# Patient Record
Sex: Female | Born: 1990 | Race: White | Hispanic: No | Marital: Single | Smoking: Current every day smoker
Health system: Southern US, Community
[De-identification: ages and names within clinical notes are randomized; demographics above are authoritative.]

## PROBLEM LIST (undated history)

## (undated) ENCOUNTER — Inpatient Hospital Stay (HOSPITAL_COMMUNITY): Payer: Self-pay

## (undated) DIAGNOSIS — R569 Unspecified convulsions: Secondary | ICD-10-CM

## (undated) DIAGNOSIS — N390 Urinary tract infection, site not specified: Secondary | ICD-10-CM

## (undated) DIAGNOSIS — C7490 Malignant neoplasm of unspecified part of unspecified adrenal gland: Secondary | ICD-10-CM

## (undated) DIAGNOSIS — F32A Depression, unspecified: Secondary | ICD-10-CM

## (undated) DIAGNOSIS — B182 Chronic viral hepatitis C: Secondary | ICD-10-CM

## (undated) DIAGNOSIS — B009 Herpesviral infection, unspecified: Secondary | ICD-10-CM

## (undated) DIAGNOSIS — F319 Bipolar disorder, unspecified: Secondary | ICD-10-CM

## (undated) DIAGNOSIS — F329 Major depressive disorder, single episode, unspecified: Secondary | ICD-10-CM

## (undated) HISTORY — DX: Depression, unspecified: F32.A

## (undated) HISTORY — PX: CHOLECYSTECTOMY: SHX55

## (undated) HISTORY — DX: Major depressive disorder, single episode, unspecified: F32.9

## (undated) HISTORY — DX: Bipolar disorder, unspecified: F31.9

## (undated) HISTORY — DX: Chronic viral hepatitis C: B18.2

## (undated) HISTORY — DX: Unspecified convulsions: R56.9

## (undated) HISTORY — DX: Herpesviral infection, unspecified: B00.9

## (undated) HISTORY — DX: Malignant neoplasm of unspecified part of unspecified adrenal gland: C74.90

## (undated) HISTORY — PX: BACK SURGERY: SHX140

---

## 2012-08-06 ENCOUNTER — Telehealth (HOSPITAL_COMMUNITY): Payer: Self-pay | Admitting: *Deleted

## 2012-08-06 ENCOUNTER — Inpatient Hospital Stay (HOSPITAL_COMMUNITY)
Admission: AD | Admit: 2012-08-06 | Payer: Medicaid Other | Source: Other Acute Inpatient Hospital | Admitting: Psychiatry

## 2013-02-27 ENCOUNTER — Encounter (HOSPITAL_COMMUNITY): Payer: Self-pay | Admitting: Family

## 2013-02-27 ENCOUNTER — Inpatient Hospital Stay (HOSPITAL_COMMUNITY)
Admission: AD | Admit: 2013-02-27 | Discharge: 2013-02-27 | Disposition: A | Discharge status: Home / Self Care | Payer: Self-pay | Source: Ambulatory Visit | Attending: Obstetrics & Gynecology | Admitting: Obstetrics & Gynecology

## 2013-02-27 ENCOUNTER — Inpatient Hospital Stay (HOSPITAL_COMMUNITY): Payer: Self-pay

## 2013-02-27 DIAGNOSIS — O99891 Other specified diseases and conditions complicating pregnancy: Secondary | ICD-10-CM | POA: Insufficient documentation

## 2013-02-27 DIAGNOSIS — O26899 Other specified pregnancy related conditions, unspecified trimester: Secondary | ICD-10-CM

## 2013-02-27 DIAGNOSIS — N949 Unspecified condition associated with female genital organs and menstrual cycle: Secondary | ICD-10-CM | POA: Insufficient documentation

## 2013-02-27 DIAGNOSIS — O9989 Other specified diseases and conditions complicating pregnancy, childbirth and the puerperium: Secondary | ICD-10-CM

## 2013-02-27 DIAGNOSIS — O99330 Smoking (tobacco) complicating pregnancy, unspecified trimester: Secondary | ICD-10-CM | POA: Insufficient documentation

## 2013-02-27 DIAGNOSIS — R109 Unspecified abdominal pain: Secondary | ICD-10-CM | POA: Insufficient documentation

## 2013-02-27 HISTORY — DX: Urinary tract infection, site not specified: N39.0

## 2013-02-27 HISTORY — DX: Unspecified convulsions: R56.9

## 2013-02-27 HISTORY — DX: Malignant neoplasm of unspecified part of unspecified adrenal gland: C74.90

## 2013-02-27 LAB — HCG, QUANTITATIVE, PREGNANCY: hCG, Beta Chain, Quant, S: 8149 m[IU]/mL — ABNORMAL HIGH (ref <5)

## 2013-02-27 LAB — URINALYSIS, ROUTINE W REFLEX MICROSCOPIC
Leukocytes, UA: NEGATIVE
Nitrite: NEGATIVE
Specific Gravity, Urine: 1.025 (ref 1.005–1.030)
Urobilinogen, UA: 0.2 mg/dL (ref 0.0–1.0)
pH: 6.5 (ref 5.0–8.0)

## 2013-02-27 LAB — CBC
MCV: 85.3 fL (ref 78.0–100.0)
Platelets: 266 10*3/uL (ref 150–400)
RBC: 4.48 MIL/uL (ref 3.87–5.11)
RDW: 12.8 % (ref 11.5–15.5)
WBC: 11.2 10*3/uL — ABNORMAL HIGH (ref 4.0–10.5)

## 2013-02-27 LAB — WET PREP, GENITAL

## 2013-02-27 NOTE — MAU Note (Signed)
22 yo female, G1P0, presents to MAU via EMS with c/o LLQ intermittent cramping since 1100 today. Denies VB. Unsure of LMP. Reports +HPT yesterday.

## 2013-02-27 NOTE — MAU Provider Note (Signed)
History     CSN: [id number]  Arrival date and time: 02/27/13 1336   None     Chief Complaint  Patient presents with  . Abdominal Pain   HPI Comments: Erika Potter 22 y.o. G1P0 presents to MAU for left lower pelvic pain in early pregnancy. She is by questionable LMP [redacted]w[redacted]d. She is hard of hearing, has history of cancer.      Abdominal Pain      Past Medical History  Diagnosis Date  . Neuroblastoma   . Seizures   . UTI (lower urinary tract infection)     Past Surgical History  Procedure Laterality Date  . Cholecystectomy      History reviewed. No pertinent family history.  History  Substance Use Topics  . Smoking status: Current Every Day Smoker -- 1.00 packs/day    Types: Cigarettes  . Smokeless tobacco: Never Used  . Alcohol Use: Yes    Allergies:  Allergies  Allergen Reactions  . Aspirin Hives    Prescriptions prior to admission  Medication Sig Dispense Refill  . Prenatal Vit-Fe Fumarate-FA (PRENATAL MULTIVITAMIN) TABS tablet Take 1 tablet by mouth daily at 12 noon.        Review of Systems  Constitutional: Negative.        Hard of hearing  HENT: Negative.   Eyes: Negative.   Respiratory: Negative.   Cardiovascular: Negative.   Gastrointestinal: Positive for abdominal pain.  Genitourinary: Negative.   Musculoskeletal: Negative.   Skin: Negative.        Abdominal scar  Neurological: Negative.   Psychiatric/Behavioral: Negative.    Physical Exam   Blood pressure 119/68, pulse 88, resp. rate 16, last menstrual period 12/30/2012.  Physical Exam  Constitutional: She is oriented to person, place, and time. She appears well-developed and well-nourished. No distress.  Hard of hearing  HENT:  Head: Normocephalic and atraumatic.  Eyes: Pupils are equal, round, and reactive to light.  Cardiovascular: Normal rate, regular rhythm and normal heart sounds.   Respiratory: Effort normal and breath sounds normal. No respiratory distress. She has  no wheezes.  GI: Soft. Bowel sounds are normal. She exhibits no mass. There is tenderness. There is no rebound.  Neurological: She is alert and oriented to person, place, and time.  Skin: Skin is warm and dry.  Psychiatric: She has a normal mood and affect. Her behavior is normal. Judgment and thought content normal.   Results for orders placed during the hospital encounter of 02/27/13 (from the past 24 hour(s))  URINALYSIS, ROUTINE W REFLEX MICROSCOPIC     Status: None   Collection Time    02/27/13  1:50 PM      Result Value Range   Color, Urine YELLOW  YELLOW   APPearance CLEAR  CLEAR   Specific Gravity, Urine 1.025  1.005 - 1.030   pH 6.5  5.0 - 8.0   Glucose, UA NEGATIVE  NEGATIVE mg/dL   Hgb urine dipstick NEGATIVE  NEGATIVE   Bilirubin Urine NEGATIVE  NEGATIVE   Ketones, ur NEGATIVE  NEGATIVE mg/dL   Protein, ur NEGATIVE  NEGATIVE mg/dL   Urobilinogen, UA 0.2  0.0 - 1.0 mg/dL   Nitrite NEGATIVE  NEGATIVE   Leukocytes, UA NEGATIVE  NEGATIVE  POCT PREGNANCY, URINE     Status: Abnormal   Collection Time    02/27/13  2:02 PM      Result Value Range   Preg Test, Ur POSITIVE (*) NEGATIVE  CBC     Status:  Abnormal   Collection Time    02/27/13  3:30 PM      Result Value Range   WBC 11.2 (*) 4.0 - 10.5 K/uL   RBC 4.48  3.87 - 5.11 MIL/uL   Hemoglobin 13.6  12.0 - 15.0 g/dL   HCT 54.0  98.1 - 19.1 %   MCV 85.3  78.0 - 100.0 fL   MCH 30.4  26.0 - 34.0 pg   MCHC 35.6  30.0 - 36.0 g/dL   RDW 47.8  29.5 - 62.1 %   Platelets 266  150 - 400 K/uL  HCG, QUANTITATIVE, PREGNANCY     Status: Abnormal   Collection Time    02/27/13  3:30 PM      Result Value Range   hCG, Beta Chain, Quant, S 8149 (*) <5 mIU/mL  ABO/RH     Status: None   Collection Time    02/27/13  3:30 PM      Result Value Range   ABO/RH(D) O NEG    WET PREP, GENITAL     Status: Abnormal   Collection Time    02/27/13  3:45 PM      Result Value Range   Yeast Wet Prep HPF POC NONE SEEN  NONE SEEN   Trich, Wet  Prep NONE SEEN  NONE SEEN   Clue Cells Wet Prep HPF POC FEW (*) NONE SEEN   WBC, Wet Prep HPF POC MODERATE (*) NONE SEEN   US Ob Comp Less 14 Wks  02/27/2013   CLINICAL DATA:  Pelvic pain.  Find  EXAM: OBSTETRIC <14 WK [place] AND TRANSVAGINAL OB US  TECHNIQUE: Both transabdominal and transvaginal ultrasound examinations were performed for complete evaluation of the gestation as well as the maternal uterus, adnexal regions, and pelvic cul-de-sac. Transvaginal technique was performed to assess early pregnancy.  COMPARISON:  None.  FINDINGS: Intrauterine gestational sac: A single intrauterine gestational sac is present.  Yolk sac:  Present  Embryo:  Not visualized  Cardiac Activity: Not visualized  MSD:  9.1  mm   5 w   4  d   [place] EDC: 10/26/2013  Maternal uterus/adnexae: The uterus is otherwise within normal limits. There is no significant subchorionic hemorrhage. The corpus luteal cyst is present on the left measuring 3.3 x 2.6 x 3.1 cm. A small right para ovarian cyst measures 9 mm. There is no significant free fluid.  IMPRESSION: 1. Single intrauterine pregnancy with an estimated gestational age of [redacted] weeks 4 days. The EDC is 10/26/2013. 2. Left-sided corpus luteal cyst measures 3.3 cm.   Electronically Signed   By: Gennette Pac   On: 02/27/2013 16:32   US Ob Transvaginal  02/27/2013   CLINICAL DATA:  Pelvic pain.  Find  EXAM: OBSTETRIC <14 WK [place] AND TRANSVAGINAL OB US  TECHNIQUE: Both transabdominal and transvaginal ultrasound examinations were performed for complete evaluation of the gestation as well as the maternal uterus, adnexal regions, and pelvic cul-de-sac. Transvaginal technique was performed to assess early pregnancy.  COMPARISON:  None.  FINDINGS: Intrauterine gestational sac: A single intrauterine gestational sac is present.  Yolk sac:  Present  Embryo:  Not visualized  Cardiac Activity: Not visualized  MSD:  9.1  mm   5 w   4  d   [place] EDC: 10/26/2013  Maternal uterus/adnexae: The uterus is  otherwise within normal limits. There is no significant subchorionic hemorrhage. The corpus luteal cyst is present on the left measuring 3.3 x 2.6 x 3.1 cm.  A small right para ovarian cyst measures 9 mm. There is no significant free fluid.  IMPRESSION: 1. Single intrauterine pregnancy with an estimated gestational age of [redacted] weeks 4 days. The EDC is 10/26/2013. 2. Left-sided corpus luteal cyst measures 3.3 cm.   Electronically Signed   By: Gennette Pac   On: 02/27/2013 16:32    MAU Course  Procedures  MDM CBC, ABORh, [place], BHCG  Assessment and Plan    A: Pain in early pregnancy  P: Above labs and ultrasound on chart Advised to get prenatal care asap secondary to negative blood type, hx cancer Start PNV daily Advised to stop smoking/ increase fluids/ rest  Carolynn Serve 02/27/2013, 3:12 PM

## 2013-02-28 LAB — GC/CHLAMYDIA PROBE AMP
CT Probe RNA: NEGATIVE
GC Probe RNA: NEGATIVE

## 2013-03-15 ENCOUNTER — Emergency Department: Payer: Self-pay | Admitting: Internal Medicine

## 2013-03-15 LAB — COMPREHENSIVE METABOLIC PANEL
Albumin: 3.8 g/dL (ref 3.4–5.0)
Anion Gap: 7 (ref 7–16)
Bilirubin,Total: 0.3 mg/dL (ref 0.2–1.0)
Calcium, Total: 9 mg/dL (ref 8.5–10.1)
Chloride: 104 mmol/L (ref 98–107)
Creatinine: 0.58 mg/dL — ABNORMAL LOW (ref 0.60–1.30)
EGFR (African American): >60
EGFR (Non-African Amer.): >60
Glucose: 145 mg/dL — ABNORMAL HIGH (ref 65–99)
Osmolality: 273 (ref 275–301)
Potassium: 3.3 mmol/L — ABNORMAL LOW (ref 3.5–5.1)
SGOT(AST): 26 U/L (ref 15–37)
SGPT (ALT): 19 U/L (ref 12–78)
Total Protein: 7.6 g/dL (ref 6.4–8.2)

## 2013-03-15 LAB — URINALYSIS, COMPLETE
Blood: NEGATIVE
Glucose,UR: NEGATIVE mg/dL (ref 0–75)
Leukocyte Esterase: NEGATIVE
Nitrite: NEGATIVE
Ph: 6 (ref 4.5–8.0)
Protein: 30
RBC,UR: 5 /HPF (ref 0–5)
Specific Gravity: 1.026 (ref 1.003–1.030)
WBC UR: 5 /HPF (ref 0–5)

## 2013-03-15 LAB — PREGNANCY, URINE: Pregnancy Test, Urine: POSITIVE m[IU]/mL

## 2013-03-15 LAB — CBC
MCH: 30.6 pg (ref 26.0–34.0)
MCHC: 35.4 g/dL (ref 32.0–36.0)
Platelet: 296 10*3/uL (ref 150–440)

## 2013-03-15 LAB — DRUG SCREEN, URINE
Cannabinoid 50 Ng, Ur ~~LOC~~: NEGATIVE (ref ?–50)
Cocaine Metabolite,Ur ~~LOC~~: NEGATIVE (ref ?–300)
Methadone, Ur Screen: NEGATIVE (ref ?–300)
Opiate, Ur Screen: NEGATIVE (ref ?–300)
Tricyclic, Ur Screen: NEGATIVE (ref ?–1000)

## 2013-03-15 LAB — ETHANOL: Ethanol: <3 mg/dL

## 2013-03-15 LAB — TSH: Thyroid Stimulating Horm: 1.87 u[IU]/mL

## 2013-03-16 LAB — HCG, QUANTITATIVE, PREGNANCY: Beta Hcg, Quant.: 73049 m[IU]/mL — ABNORMAL HIGH

## 2013-07-11 LAB — OB RESULTS CONSOLE HGB/HCT, BLOOD
HCT: 34 %
HEMOGLOBIN: 12.3 g/dL

## 2013-07-11 LAB — OB RESULTS CONSOLE GC/CHLAMYDIA
Chlamydia: NEGATIVE
GC PROBE AMP, GENITAL: NEGATIVE

## 2013-07-11 LAB — CULTURE, OB URINE: URINE CULTURE, OB: NEGATIVE

## 2013-07-11 LAB — OB RESULTS CONSOLE HEPATITIS B SURFACE ANTIGEN: Hepatitis B Surface Ag: NEGATIVE

## 2013-07-11 LAB — OB RESULTS CONSOLE RPR: RPR: NONREACTIVE

## 2013-07-11 LAB — GLUCOSE TOLERANCE, 1 HOUR (50G) W/O FASTING: Glucose, GTT - 1 Hour: 96 mg/dL (ref ?–200)

## 2013-07-11 LAB — GLUCOSE TOLERANCE, 1 HOUR: GLUCOSE 1 HOUR GTT: 96

## 2013-07-20 ENCOUNTER — Encounter: Payer: Self-pay | Admitting: *Deleted

## 2013-07-21 ENCOUNTER — Encounter: Payer: Self-pay | Admitting: Obstetrics and Gynecology

## 2013-08-11 ENCOUNTER — Encounter: Payer: Self-pay | Admitting: Family

## 2013-08-18 ENCOUNTER — Encounter: Payer: Self-pay | Admitting: Family

## 2013-08-20 ENCOUNTER — Encounter: Payer: Self-pay | Admitting: *Deleted

## 2013-08-23 ENCOUNTER — Encounter: Payer: Self-pay | Admitting: *Deleted

## 2013-12-06 ENCOUNTER — Encounter (HOSPITAL_COMMUNITY): Payer: Self-pay

## 2014-01-02 ENCOUNTER — Encounter (HOSPITAL_COMMUNITY): Payer: Self-pay | Admitting: *Deleted

## 2014-02-16 IMAGING — US US OB COMP LESS 14 WK
1 series · 14 of 28 positions shown · non-contrast
Comparison: None.

CLINICAL DATA: Pelvic pain.  Find

EXAM:
OBSTETRIC <14 WK US AND TRANSVAGINAL OB US
TECHNIQUE: Both transabdominal and transvaginal ultrasound examinations were
performed for complete evaluation of the gestation as well as the
maternal uterus, adnexal regions, and pelvic cul-de-sac.
Transvaginal technique was performed to assess early pregnancy.

[Series 1: us ob comp less 14 wks · 14 of 38 slices shown]
[im 2/38]
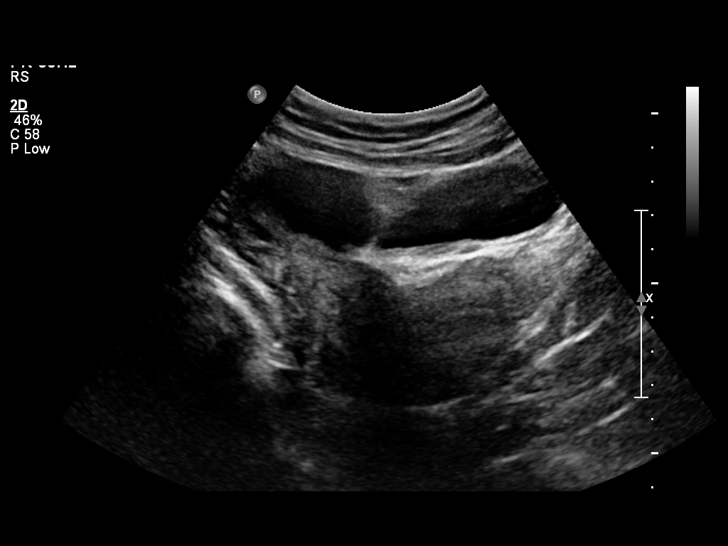
[im 5/38]
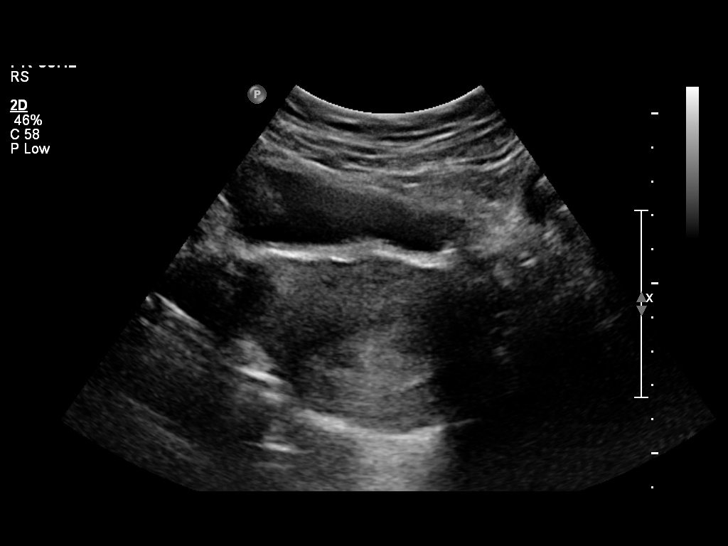
[im 7/38]
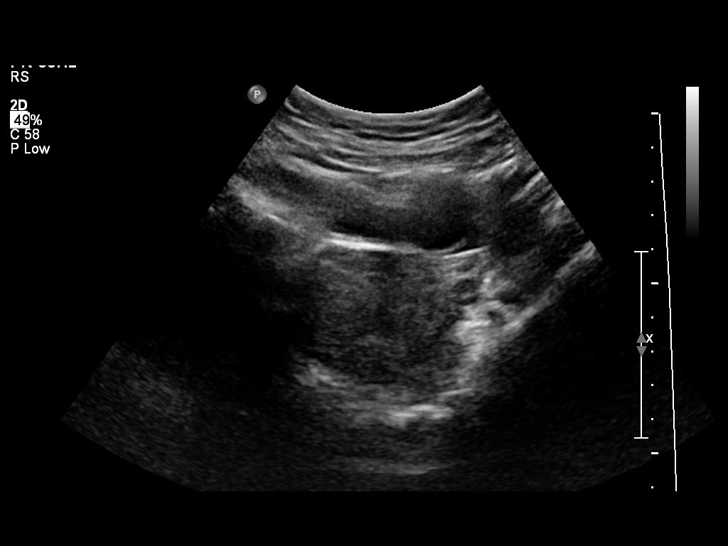
[im 10/38]
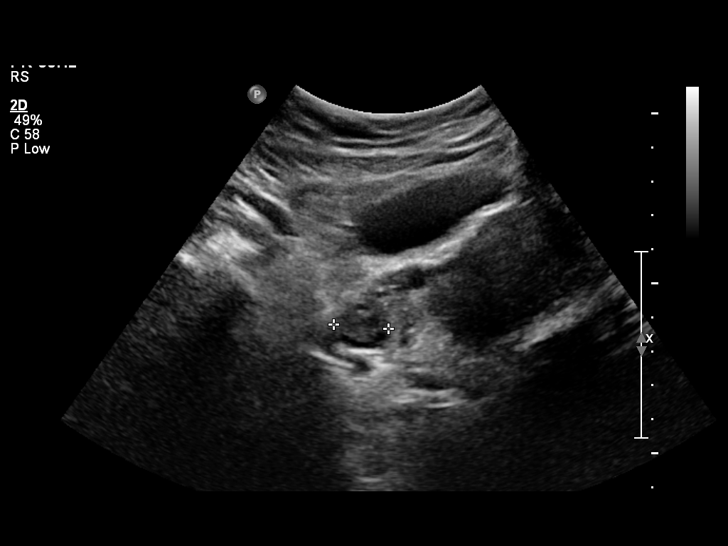
[im 13/38]
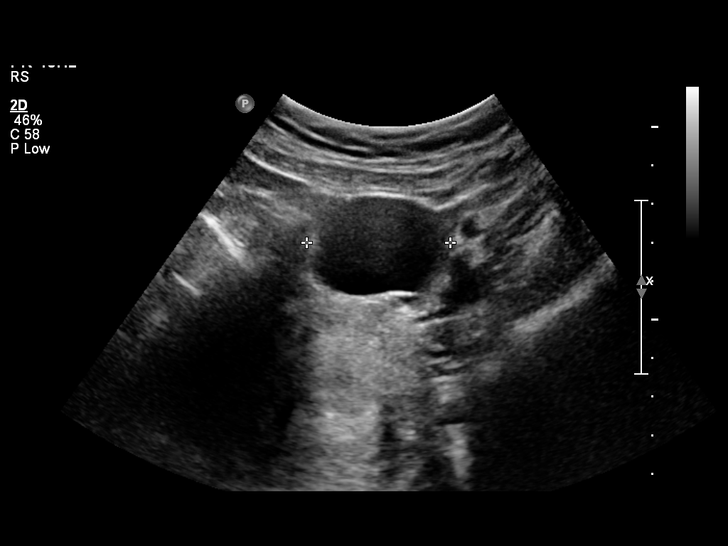
[im 16/38]
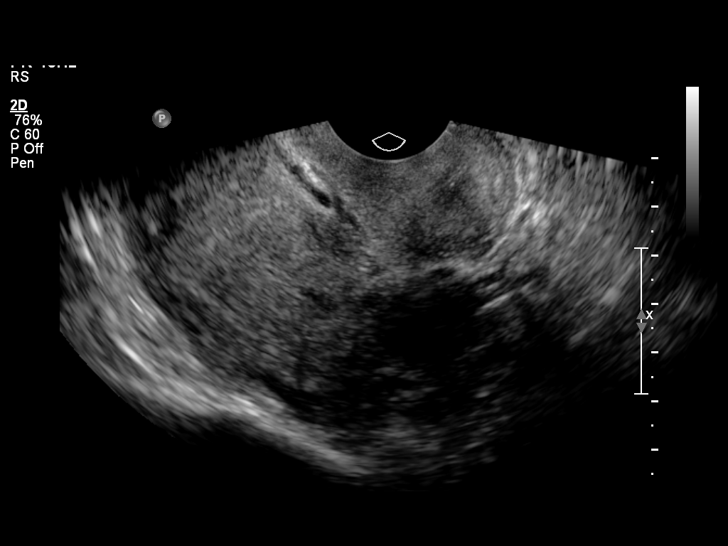
[im 18/38]
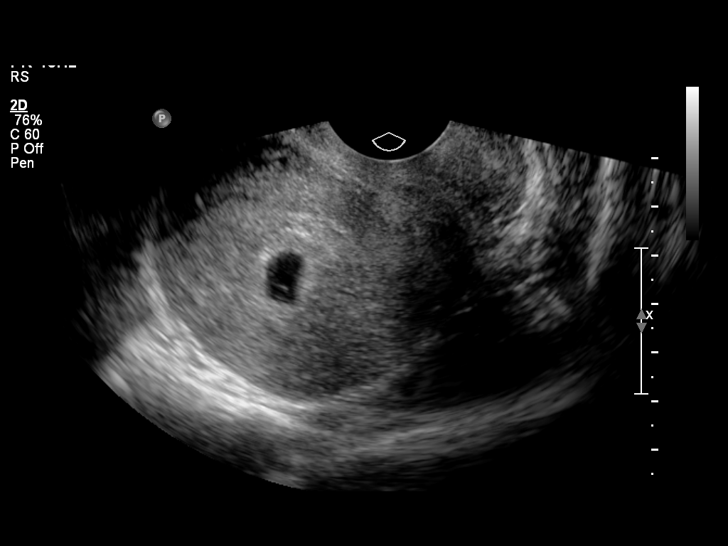
[im 21/38]
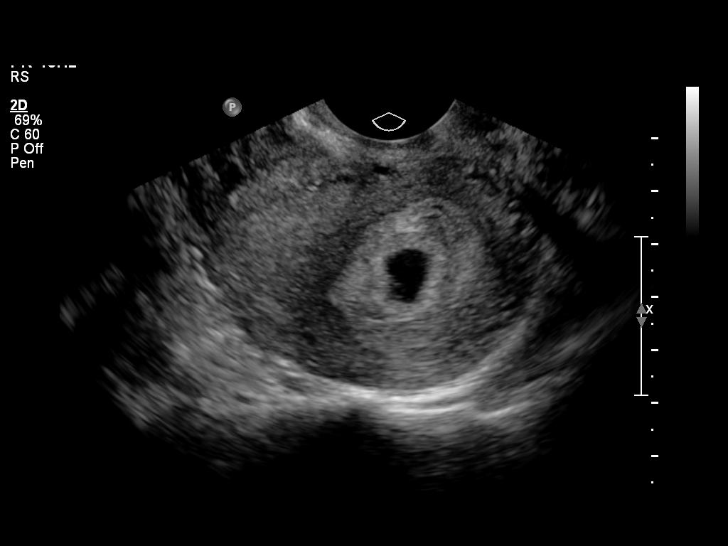
[im 24/38]
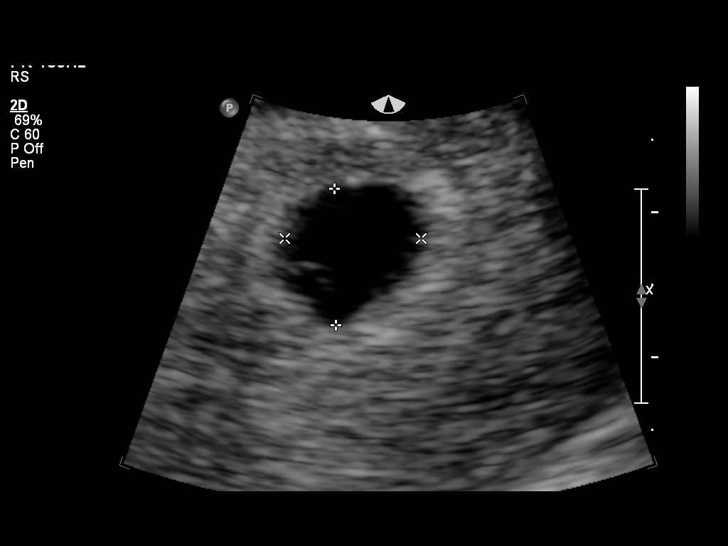
[im 27/38]
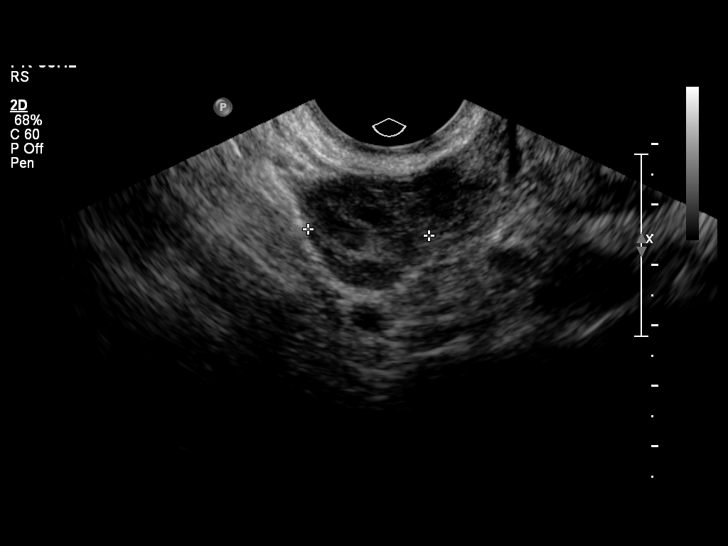
[im 29/38]
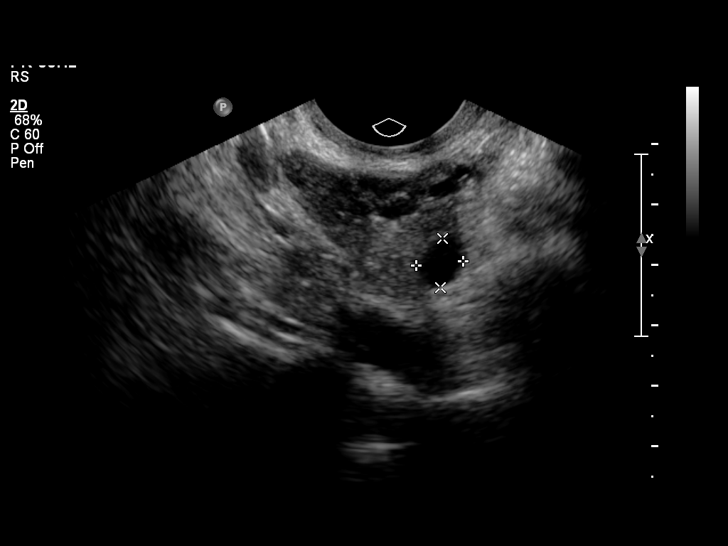
[im 32/38]
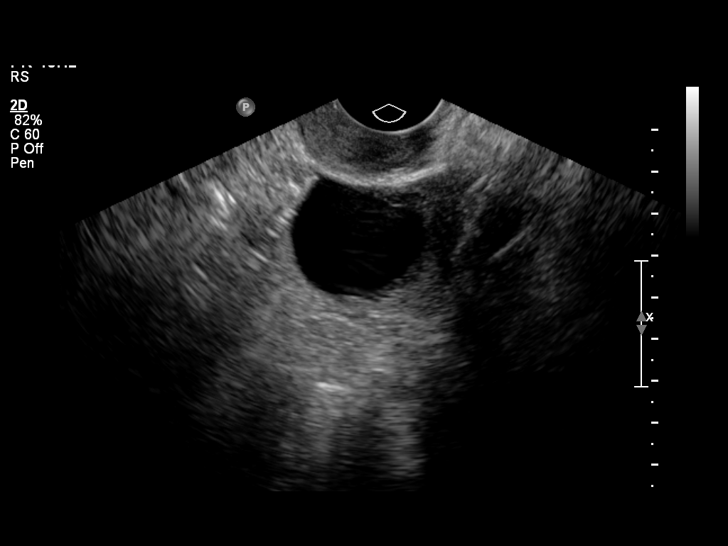
[im 35/38]
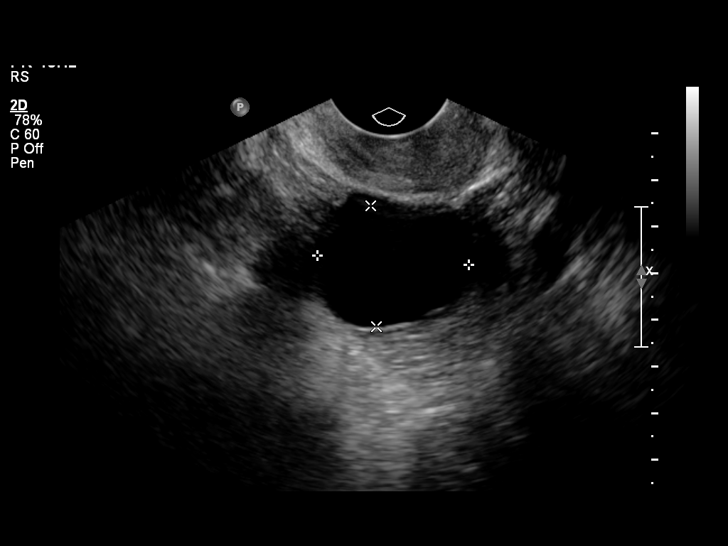
[im 38/38]
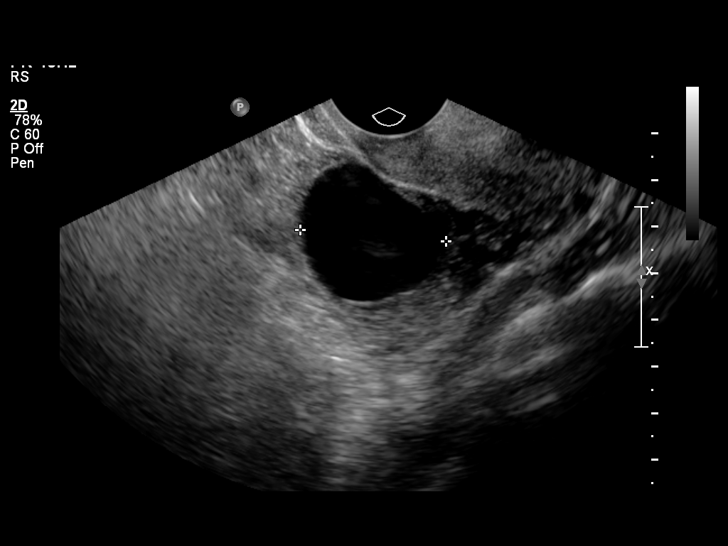

[14 of 28 positions shown; findings below may reference images not displayed]

FINDINGS: Intrauterine gestational sac: A single intrauterine gestational sac
is present.

Yolk sac:  Present

Embryo:  Not visualized

Cardiac Activity: Not visualized

MSD:  9.1  mm   5 w   4  d   US EDC: 10/26/2013

Maternal uterus/adnexae: The uterus is otherwise within normal
limits. There is no significant subchorionic hemorrhage. The corpus
luteal cyst is present on the left measuring 3.3 x 2.6 x 3.1 cm. A
small right para ovarian cyst measures 9 mm. There is no significant
free fluid.
IMPRESSION: 1. Single intrauterine pregnancy with an estimated gestational age
of 5 weeks 4 days. The EDC is 10/26/2013.
2. Left-sided corpus luteal cyst measures 3.3 cm.

## 2014-04-17 ENCOUNTER — Encounter (HOSPITAL_COMMUNITY): Payer: Self-pay | Admitting: *Deleted

## 2014-10-06 NOTE — Consult Note (Signed)
Brief Consult Note: Diagnosis: no diagnosis.   Patient was seen by consultant.   Consult note dictated.   Discussed with Attending MD.   Comments: Psychiatry: PAtient seen and chart reviewed. Note dictated. Patient denies any SI and denies any mood or psychhotic symptoms. Has no acute psychiatric issues. Will go back to shelter in [place].  Electronic Signatures: Gonzella Lex (MD)  (Signed 01-Oct-14 15:35)  Authored: Brief Consult Note   Last Updated: 01-Oct-14 15:35 by Gonzella Lex (MD)
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
## 2016-04-29 ENCOUNTER — Ambulatory Visit: Payer: No Typology Code available for payment source

## 2016-04-29 VITALS — BP 125/83 | HR 70 | Temp 101.8°F | Resp 20 | Ht 63.0 in | Wt 140.0 lb

## 2016-04-29 DIAGNOSIS — T7621XA Adult sexual abuse, suspected, initial encounter: Secondary | ICD-10-CM | POA: Insufficient documentation

## 2016-04-29 LAB — POCT PREGNANCY TEST, URINE HCG: POCT Pregnancy HCG Test, UR: NEGATIVE

## 2016-04-29 MED ORDER — LIDOCAINE HCL 1 % IJ SOLN
INTRAMUSCULAR | Status: AC
Start: 2016-04-29 — End: ?
  Filled 2016-04-29: qty 20

## 2016-04-29 MED ORDER — METRONIDAZOLE 500 MG PO TABS
2000.0000 mg | ORAL_TABLET | Freq: Once | ORAL | Status: AC
Start: 2016-04-29 — End: 2016-04-29
  Administered 2016-04-29: 2000 mg via ORAL

## 2016-04-29 MED ORDER — IBUPROFEN 400 MG PO TABS
ORAL_TABLET | ORAL | Status: AC
Start: 2016-04-29 — End: ?
  Filled 2016-04-29: qty 2

## 2016-04-29 MED ORDER — IBUPROFEN 400 MG PO TABS
800.0000 mg | ORAL_TABLET | Freq: Once | ORAL | Status: AC
Start: 2016-04-29 — End: 2016-04-29
  Administered 2016-04-29: 800 mg via ORAL

## 2016-04-29 MED ORDER — ONDANSETRON 4 MG PO TBDP
ORAL_TABLET | ORAL | Status: AC
Start: 2016-04-29 — End: ?
  Filled 2016-04-29: qty 1

## 2016-04-29 MED ORDER — ONDANSETRON 4 MG PO TBDP
4.0000 mg | ORAL_TABLET | Freq: Once | ORAL | Status: AC
Start: 2016-04-29 — End: 2016-04-29
  Administered 2016-04-29: 4 mg via ORAL

## 2016-04-29 MED ORDER — AZITHROMYCIN 250 MG PO TABS
ORAL_TABLET | ORAL | Status: AC
Start: 2016-04-29 — End: ?
  Filled 2016-04-29: qty 4

## 2016-04-29 MED ORDER — CEFTRIAXONE SODIUM 250 MG IJ SOLR
INTRAMUSCULAR | Status: AC
Start: 2016-04-29 — End: ?
  Filled 2016-04-29: qty 250

## 2016-04-29 MED ORDER — METRONIDAZOLE 500 MG PO TABS
ORAL_TABLET | ORAL | Status: AC
Start: 2016-04-29 — End: ?
  Filled 2016-04-29: qty 4

## 2016-04-29 MED ORDER — CEFTRIAXONE SODIUM 250 MG IJ SOLR
250.0000 mg | Freq: Once | INTRAMUSCULAR | Status: AC
Start: 2016-04-29 — End: 2016-04-29
  Administered 2016-04-29: 250 mg via INTRAMUSCULAR

## 2016-04-29 MED ORDER — ACETAMINOPHEN 325 MG PO TABS
ORAL_TABLET | ORAL | Status: AC
Start: 2016-04-29 — End: ?
  Filled 2016-04-29: qty 2

## 2016-04-29 MED ORDER — ACETAMINOPHEN 325 MG PO TABS
650.0000 mg | ORAL_TABLET | Freq: Once | ORAL | Status: AC
Start: 2016-04-29 — End: 2016-04-29
  Administered 2016-04-29: 650 mg via ORAL

## 2016-04-29 MED ORDER — AZITHROMYCIN 250 MG PO TABS
1000.0000 mg | ORAL_TABLET | Freq: Once | ORAL | Status: AC
Start: 2016-04-29 — End: 2016-04-29
  Administered 2016-04-29: 1000 mg via ORAL
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
## 2016-04-30 LAB — HIV-1/2 AG/AB 4TH GEN. W/ REFLEX: HIV Ag/Ab, 4th Generation: NONREACTIVE

## 2016-04-30 LAB — CLINICAL MEDICOLEGAL FEE

## 2016-05-02 LAB — URINE MICROSCOPIC: Hyaline Casts, UA: NONE SEEN /LPF

## 2016-05-02 LAB — CLINICAL MEDICOLEGAL FEE

## 2016-05-04 LAB — CLINICAL MEDICOLEGAL FEE

## 2016-05-04 LAB — RPR SCREEN W-TITER RFX: RPR Screen: NONREACTIVE

## 2016-05-04 LAB — CHLAMYDIA TRACHOMATIS AND NEISSERIA GONORRHOEAE, RNA, TMA
Chlamydia trachomatis RNA, TMA: DETECTED — AB
N. gonorrhoeae RNA, TMA: NOT DETECTED

## 2016-05-04 LAB — TRICHOMONAS VAGINALIS RNA, FEMALES: Trichomonas Vaginalis RNA, QL TMA: NOT DETECTED

## 2016-05-04 LAB — CT/GC NAA, RECTAL
Chlamydia trachomatis RNA, TMA: DETECTED — AB
N. gonorrhoeae RNA, TMA: NOT DETECTED

## 2016-05-04 LAB — QUEST ~~LOC~~ UROGENITAL CULTURE

## 2016-05-04 LAB — HEPATITIS C RNA, QUANTITATIVE REAL-TIME PCR
HCV RNA PCR Quantitative IU/mL: 14229 IU/mL — ABNORMAL HIGH (ref <15)
HCV RNA PCR Quantitative Log IU/mL: 4.15 log IU/mL — ABNORMAL HIGH (ref <1.18)

## 2016-05-04 LAB — HEPATITIS B SURFACE ANTIBODY: Hepatitis B Surface Ab, Immunity, QT: 34 m[IU]/mL (ref >=10)

## 2016-05-04 LAB — QUEST ~~LOC~~ URINE CULTURE

## 2023-06-26 ENCOUNTER — Emergency Department
Admission: EM | Admit: 2023-06-26 | Discharge: 2023-06-27 | Disposition: A | Discharge status: Home / Self Care | Payer: 59 | Attending: Student in an Organized Health Care Education/Training Program | Admitting: Student in an Organized Health Care Education/Training Program

## 2023-06-26 ENCOUNTER — Emergency Department: Payer: 59

## 2023-06-26 DIAGNOSIS — A084 Viral intestinal infection, unspecified: Secondary | ICD-10-CM | POA: Insufficient documentation

## 2023-06-26 LAB — COMPREHENSIVE METABOLIC PANEL
ALT: 31 U/L (ref <=55)
AST (SGOT): 31 U/L (ref <=41)
Albumin/Globulin Ratio: 1.2 (ref 0.9–2.2)
Albumin: 4.6 g/dL (ref 3.5–5.0)
Alkaline Phosphatase: 110 U/L (ref 37–117)
Anion Gap: 10 (ref 5.0–15.0)
BUN: 7 mg/dL (ref 7–21)
Bilirubin, Total: 0.6 mg/dL (ref 0.2–1.2)
CO2: 23 meq/L (ref 17–29)
Calcium: 10 mg/dL (ref 8.5–10.5)
Chloride: 105 meq/L (ref 99–111)
Creatinine: 0.5 mg/dL (ref 0.4–1.0)
GFR: >60 mL/min/{1.73_m2} (ref >=60.0)
Globulin: 4 g/dL — ABNORMAL HIGH (ref 2.0–3.6)
Glucose: 113 mg/dL — ABNORMAL HIGH (ref 70–100)
Potassium: 3.8 meq/L (ref 3.5–5.3)
Protein, Total: 8.6 g/dL — ABNORMAL HIGH (ref 6.0–8.3)
Sodium: 138 meq/L (ref 135–145)

## 2023-06-26 LAB — LAB USE ONLY - CBC WITH DIFFERENTIAL
Absolute Basophils: 0.04 10*3/uL (ref 0.00–0.08)
Absolute Eosinophils: 0.03 10*3/uL (ref 0.00–0.44)
Absolute Immature Granulocytes: 0.04 10*3/uL (ref 0.00–0.07)
Absolute Lymphocytes: 0.95 10*3/uL (ref 0.42–3.22)
Absolute Monocytes: 0.4 10*3/uL (ref 0.21–0.85)
Absolute Neutrophils: 9.01 10*3/uL — ABNORMAL HIGH (ref 1.10–6.33)
Absolute nRBC: 0 10*3/uL (ref <=0.00)
Basophils %: 0.4 %
Eosinophils %: 0.3 %
Hematocrit: 43.9 % — ABNORMAL HIGH (ref 34.7–43.7)
Hemoglobin: 15.8 g/dL — ABNORMAL HIGH (ref 11.4–14.8)
Immature Granulocytes %: 0.4 %
Lymphocytes %: 9.1 %
MCH: 30.4 pg (ref 25.1–33.5)
MCHC: 36 g/dL — ABNORMAL HIGH (ref 31.5–35.8)
MCV: 84.6 fL (ref 78.0–96.0)
MPV: 9.9 fL (ref 8.9–12.5)
Monocytes %: 3.8 %
Neutrophils %: 86 %
Platelet Count: 303 10*3/uL (ref 142–346)
Preliminary Absolute Neutrophil Count: 9.01 10*3/uL — ABNORMAL HIGH (ref 1.10–6.33)
RBC: 5.19 10*6/uL — ABNORMAL HIGH (ref 3.90–5.10)
RDW: 12 % (ref 11–15)
WBC: 10.47 10*3/uL — ABNORMAL HIGH (ref 3.10–9.50)
nRBC %: 0 /100{WBCs} (ref <=0.0)

## 2023-06-26 LAB — BETA HCG QUANTITATIVE, PREGNANCY: hCG, Quantitative: <2.4 m[IU]/mL

## 2023-06-26 LAB — LIPASE: Lipase: 19 U/L (ref 8–78)

## 2023-06-26 MED ORDER — SODIUM CHLORIDE 0.9 % IV BOLUS
1000.0000 mL | Freq: Once | INTRAVENOUS | Status: AC
Start: 2023-06-26 — End: 2023-06-27
  Administered 2023-06-26: 1000 mL via INTRAVENOUS

## 2023-06-26 MED ORDER — ONDANSETRON HCL 4 MG/2ML IJ SOLN
4.0000 mg | Freq: Once | INTRAMUSCULAR | Status: AC
Start: 2023-06-26 — End: 2023-06-26
  Administered 2023-06-26: 4 mg via INTRAVENOUS
  Filled 2023-06-26: qty 2

## 2023-06-26 NOTE — ED Provider Notes (Signed)
 [facility] EMERGENCY DEPARTMENT  ATTENDING PHYSICIAN HISTORY AND PHYSICAL EXAM     Patient Name: Kayla Foster, Kayla Foster  Department:[facility] EMERGENCY DEPT  Encounter Date:  06/26/2023  Attending Physician: Mayme Reas, MD   Age: 33 y.o. female  Patient Room: S 06/S 06  PCP: Freddrick Showman, MD           Diagnosis/Disposition:     Final diagnoses:   Viral gastroenteritis       ED Disposition       ED Disposition   Discharge    Condition   --    Date/Time   Sat Jun 27, 2023  1:21 AM    Comment   Kayla Foster discharge to home/self care.    Condition at disposition: Stable                 Follow-Up Providers (if applicable)    Your PCP          [facility] Emergency Dept  [address]  [place]  [phone]  [phone]  Go to   If symptoms worsen       Discharge Medication List as of 06/27/2023  1:41 AM        START taking these medications    Details   ondansetron  (ZOFRAN -ODT) 4 MG disintegrating tablet Take 1 tablet (4 mg) by mouth every 6 (six) hours as needed for Nausea, Starting Sat 06/27/2023, Print                 Medical Decision Making:          Plan:  Cova Knieriem is a 33 y.o. female with PMH of seizure disorder and gallbladder cancer s/p cholecystectomy presenting with nausea, vomiting, diarrhea, generalized abdominal pain, and headache.   Workup included CBC, CMP, lipase, hCG, urinalysis, CT abdomen pelvis.  Patient given fluids and Zofran  while awaiting workup.  Lab with a white count of 10.  47 but otherwise fairly unremarkable.  Urinalysis negative for infection.  CT scan negative for acute processes.  Patient with significant improvement of symptoms on reevaluation.  Patient tolerating oral intake in emergency department.  Will discharge home with Zofran  and referral to PCP for close follow-up.    Final Impression:  Viral gastroenteritis, abdominal pain  The patient was deemed stable for discharge. They were given strict return precautions as it relates to their presumed diagnosis,  verbalized understanding of these precautions and agreed to follow up as instructed. All questions were answered prior to discharge.         Medical Decision Making  Amount and/or Complexity of Data Reviewed  Labs: ordered.  Radiology: ordered.    Risk  Prescription drug management.                            History of Presenting Illness:     Nursing Triage note:    Chief complaint: Abdominal Pain, Nausea, Emesis, and Diarrhea    Oneisha Kaarin Foster is a 33 y.o. female with PMH of seizure disorder and gallbladder cancer s/p cholecystectomy presenting with nausea, vomiting, diarrhea, generalized abdominal pain, and headache.     Caretakers at bedside providing information regarding patient's symptoms. Per caretaker, she woke up this morning complaining of diffuse abdominal pain and headache. Later in the day, she felt nauseas and began vomiting. She has been unable to tolerate fluids/food all day. Caretakers state that she had vomited 3 times today, last episode @ 7:30/8:00  pm. They mention noticing 1 episode of diarrhea yesterday, last BM was this morning. No known sick contacts at group home. No exposure to new or spicy foods. Denies having a headache at this time.     History of abdominal surgery, c-section (2015) and cholecystectomy (11 years ago).     No fevers, chills, cough, congestion, dysuria, changes in urinary frequency.         Review of Systems:  Physical Exam:     Review of Systems    Positive and negative ROS per above and in HPI. All other systems reviewed and negative.     Triage Vitals: Pulse (!) 103  BP 124/81  Resp 18  SpO2 96 %  Temp (!) 96.8 F (36 C)     Physical Exam    GENERAL APPEARANCE:  AxOx4, generally well-appearing,no acute distress.  HEENT:  NC, AT. MMM. EOMI, clear conjunctiva, oropharynx clear.  NECK:  Supple without lymphadenopathy.  No stiffness or restricted ROM.  HEART:  Normal rate and regular rhythm  LUNGS:  CTAB, moving air well. No crackles or wheezes are  heard.  ABDOMEN:  Generalized abdominal tenderness to palpation, soft, non distended   BACK: No CVAT, no obvious deformity.  EXTREMITIES:  Without cyanosis, clubbing or edema.  NEUROLOGICAL:  Grossly nonfocal. Alert and oriented x4, moving all 4 extremities, strength intact sensation intact. CN not formally tested but appear grossly intact. Observed to ambulate with normal gait.  Skin:  Warm and dry without any rash.        Interpretations, Clinical Decision Tools and Critical Care:       O2 Sat:  The patient's oxygen saturation was 96 % on room air. This was independently interpreted by me as Normal.         Procedures:   Procedures      Attestations:     Scribe Attestation: I was acting as a neurosurgeon for Mayme Reas, MD on Koerner,Chidinma KAYLAN   Treatment Team: Scribe: Maisie Chiquita HERO    Documentation Notes:  Parts of this note were generated by the Epic EMR system/ Dragon speech recognition and may contain inherent errors or omissions not intended by the user. Grammatical errors, random word insertions, deletions, pronoun errors and incomplete sentences are occasional consequences of this technology due to software limitations. Not all errors are caught or corrected.  My documentation is often completed after the patient is no longer under my clinical care. In some cases, the Epic EMR may pull updated results into the above documentation which may not reflect all results or information that were available to me at the time of my medical decision making.   If there are questions or concerns about the content of this note or information contained within the body of this dictation they should be addressed directly with the author for clarification.                  Mayme Reas, MD  06/29/23 [phone]

## 2023-06-26 NOTE — ED Triage Notes (Addendum)
 P.t with hx of seizure and  cholecystectomy came to ED c/o diffuse abdominal pain, nausea, diarrhea x1 time and vomiting x3 times since today morning. P.t denies fever, chest pain, UTI symptoms.

## 2023-06-27 LAB — URINALYSIS WITH REFLEX TO MICROSCOPIC EXAM - REFLEX TO CULTURE
Urine Bilirubin: NEGATIVE
Urine Blood: NEGATIVE
Urine Glucose: NEGATIVE
Urine Nitrite: NEGATIVE
Urine Protein: NEGATIVE
Urine Specific Gravity: 1.04 — ABNORMAL HIGH (ref 1.001–1.035)
Urine Urobilinogen: NORMAL mg/dL (ref 0.2–2.0)
Urine pH: 8 (ref 5.0–8.0)

## 2023-06-27 LAB — LAB USE ONLY - URINE GRAY CULTURE HOLD TUBE

## 2023-06-27 MED ORDER — ONDANSETRON 4 MG PO TBDP: 4.0000 mg | ORAL_TABLET | Freq: Four times a day (QID) | ORAL | 0 refills | Status: DC | PRN

## 2023-06-27 MED ORDER — IOHEXOL 350 MG/ML IV SOLN
100.0000 mL | Freq: Once | INTRAVENOUS | Status: AC | PRN
Start: 2023-06-27 — End: 2023-06-27
  Administered 2023-06-27: 100 mL via INTRAVENOUS

## 2023-06-27 NOTE — ED Notes (Signed)
Bed: S 06  Expected date:   Expected time:   Means of arrival:   Comments:  RN has B2

## 2023-06-27 NOTE — ED Notes (Signed)
 Pt tolerated  PO fluids, Provider made aware

## 2023-07-13 ENCOUNTER — Ambulatory Visit (INDEPENDENT_AMBULATORY_CARE_PROVIDER_SITE_OTHER): Payer: 59 | Admitting: Family Medicine

## 2023-07-16 ENCOUNTER — Encounter (INDEPENDENT_AMBULATORY_CARE_PROVIDER_SITE_OTHER): Payer: Self-pay | Admitting: Family Medicine

## 2023-07-16 ENCOUNTER — Ambulatory Visit (INDEPENDENT_AMBULATORY_CARE_PROVIDER_SITE_OTHER): Payer: 59 | Admitting: Family Medicine

## 2023-07-16 VITALS — BP 114/78 | HR 94 | Temp 98.1°F

## 2023-07-16 DIAGNOSIS — D3610 Benign neoplasm of peripheral nerves and autonomic nervous system, unspecified: Secondary | ICD-10-CM

## 2023-07-16 DIAGNOSIS — Z0100 Encounter for examination of eyes and vision without abnormal findings: Secondary | ICD-10-CM

## 2023-07-16 DIAGNOSIS — F32A Depression, unspecified: Secondary | ICD-10-CM

## 2023-07-16 DIAGNOSIS — H9190 Unspecified hearing loss, unspecified ear: Secondary | ICD-10-CM

## 2023-07-16 DIAGNOSIS — R2681 Unsteadiness on feet: Secondary | ICD-10-CM

## 2023-07-16 DIAGNOSIS — G40909 Epilepsy, unspecified, not intractable, without status epilepticus: Secondary | ICD-10-CM

## 2023-07-16 DIAGNOSIS — B009 Herpesviral infection, unspecified: Secondary | ICD-10-CM

## 2023-07-16 DIAGNOSIS — Z309 Encounter for contraceptive management, unspecified: Secondary | ICD-10-CM

## 2023-07-16 DIAGNOSIS — F419 Anxiety disorder, unspecified: Secondary | ICD-10-CM

## 2023-07-16 MED ORDER — LEVETIRACETAM 500 MG PO TABS
1000.0000 mg | ORAL_TABLET | Freq: Two times a day (BID) | ORAL | 2 refills | Status: DC
Start: 2023-07-16 — End: 2023-10-01

## 2023-07-16 MED ORDER — ACYCLOVIR 400 MG PO TABS
400.0000 mg | ORAL_TABLET | Freq: Every day | ORAL | 0 refills | Status: DC
Start: 2023-07-16 — End: 2023-08-26

## 2023-07-16 NOTE — Progress Notes (Signed)
 [name]   OFFICE VISIT                HPI     Chief Complaint   Patient presents with   . Follow-up     Pt went to ER for N/V. Pt reports having resolved sxs but reports having headaches.    . Medication Refill     Pt requests refill for kepra      HPI    33 year old female with past medical history of seizure disorder, gallbladder cancer status post cholecystectomy, who presents for ER follow-up.    Per review of prior chart, patient was seen in the ER on 06/26/2023 for concerns of viral gastroenteritis.  Kayla Foster is a 33 y.o. female with PMH of seizure disorder and gallbladder cancer s/p cholecystectomy presenting with nausea, vomiting, diarrhea, generalized abdominal pain, and headache.   Workup included CBC, CMP, lipase, hCG, urinalysis, CT abdomen pelvis.  Patient given fluids and Zofran  while awaiting workup.  Lab with a white count of 10.47 but otherwise fairly unremarkable.  Urinalysis negative for infection.  CT scan negative for acute processes.  Patient with significant improvement of symptoms on reevaluation.  Patient tolerating oral intake in emergency department.  Will discharge home with Zofran  and referral to PCP for close follow-up.    Patient here with manager from [facility] in [place], facility where patient is currently residing.  Manager reports that patient is currently at her baseline from when she arrived to the facility in November, 2024.  Patient reports that she is currently at her baseline at this time.  Reports history of seizure disorder starting in her 20s.  Patient was previously followed by neurology at [facility] , last seen on 01/27/2023.  Patient was still having breakthrough seizures at that time, so Keppra  was increased to 1000 mg twice daily.  Slight discrepancy noted, because at that time gait was noted to be appropriate, neurosurgery note/evaluation 03/10/2023 indicated concerns of poor balance/gait impairment, requiring a  walker to ambulate.  Patient reports she has had difficulty ambulating since her back surgery.  She also reports that she has tremors of bilateral hands, however that has been chronic.  Patient self discontinued all of her medications including Prozac , benztropine, quetiapine .  Reports that she will not be taking those medications and she wants to start fresh.  Benztropine rx previously for ?tremors.  Prozac /quetiapine  for anxiety/depression per patient.  Per neurology note, patient was previously on Latuda , however unable to find documentation for this on recent medications.  Patient reports that she had a seizure 2 weeks ago, however told the facility after the fact, no physician evaluation at that time.  Patient reports the only 2 medications she is taking at this time is her Keppra  and acyclovir  for herpes prophylaxis.  She is concerned with adding any additional medications at this time, states her body reacts differently to medications and she is concerned with the medications that she was previously on in the past, states he did not like how it made her feel and that she was unsure if the medications were correct when she was receiving it in the prison.  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     ROS   Review of Systems   Constitutional:  Negative for chills and fever.   Eyes:  Negative for visual disturbance.   Respiratory:  Negative for cough and shortness of breath.    Cardiovascular:  Negative for chest pain, palpitations and leg  swelling.   Gastrointestinal:  Negative for abdominal pain, constipation, diarrhea, nausea and vomiting.   Musculoskeletal:  Positive for gait problem (chronic). Negative for back pain and neck pain.   Skin:  Negative for rash.   Neurological:  Positive for tremors (chronic) and seizures. Negative for dizziness, syncope, facial asymmetry, weakness, light-headedness, numbness and headaches.       Vital Signs   BP 114/78 (BP Site: Left arm)   Pulse 94   Temp 98.1 F (36.7  C) (Oral)   SpO2 97%     Physical Exam   Physical Exam  Vitals reviewed.   Constitutional:       General: She is not in acute distress.     Appearance: Normal appearance. She is not ill-appearing or toxic-appearing.   HENT:      Head: Normocephalic.      Nose: Nose normal.      Mouth/Throat:      Mouth: Mucous membranes are moist.      Pharynx: Oropharynx is clear. No oropharyngeal exudate or posterior oropharyngeal erythema.   Eyes:      General: No scleral icterus.     Extraocular Movements: Extraocular movements intact.      Conjunctiva/sclera: Conjunctivae normal.      Pupils: Pupils are equal, round, and reactive to light.   Cardiovascular:      Rate and Rhythm: Normal rate and regular rhythm.      Heart sounds: No murmur heard.  Pulmonary:      Effort: Pulmonary effort is normal. No respiratory distress.      Breath sounds: Normal breath sounds. No stridor. No wheezing.   Abdominal:      General: Abdomen is flat.      Palpations: Abdomen is soft.      Tenderness: There is no abdominal tenderness.   Musculoskeletal:      Cervical back: Normal range of motion and neck supple. No tenderness.      Right lower leg: No edema.      Left lower leg: No edema.   Skin:     General: Skin is warm.   Neurological:      Mental Status: She is alert and oriented to person, place, and time. Mental status is at baseline.      Cranial Nerves: No cranial nerve deficit.      Sensory: No sensory deficit.      Motor: No weakness.      Comments: Mild hesitation noted w/ finger to nose b/l (pt reports chronic tremors of b/l hands). Ataxic gait noted (pt currently in wheelchair, uses walker also, chronic per pt and pt's facility manager who brought her to appt).   Psychiatric:         Mood and Affect: Mood normal.         Assessment/Plan     Assessment & Plan  Seizure disorder  Chronic.  Previously seen by neurology when she was incarcerated, last seen on 03/10/2023.  Per review of the note, patient supposed to be on Keppra  and Latuda ,  however patient reports she was only taking Keppra  during her incarceration and has only been on Keppra  since being released.  Patient reports recent seizure 2 weeks ago at the facility she resides in.  States she does not like to go to the emergency department, reiterated importance if any recurrent seizure episodes, patient needs to be evaluated emergently.  Per review of prior chart, trying to confirm patient's baseline, and ER note on 11/09/2021, evaluation  for seizure at that time, with noted ataxic gait (reported chronic per patient), neurosurgery note on 03/06/2023 indicated concerns with gait/unsteadiness requiring a rollator to ambulate.  Other conflicting notes stating gait was unremarkable.  When asking patient and patient's manager at the facility, patient reports this has been chronic since her back surgery in 2022 and chronic tremors of bilateral hands ongoing for years prior.  Manager at the facility also confirms that patient is currently at her baseline since she arrived to the facility on 04/2023. MRI Brain 12/18/2021 indicated no evidence of significant intracranial abnormality.  Redemonstrated well circumscribed, enhancing submucosal mass in the right tonsillar fossa measuring 1.9 cm, minimally increased in size from 2020, favored benign. Of note, pt had removal by ENT of tonsillar mass on 04/04/22.  Will provide urgent neurology referral at this time for patient to establish care and have referrals coordinator also assist.  Also given unsteadiness status post back surgery per patient, patient would like to be evaluated by a new neurosurgeon, will provide referral.  Also discussed if residual complications s/p back surgery, evaluation by PM&R physician for rehabilitation could be a good option also, patient agrees, will provide referral.  Strict ER precautions discussed at length.    Orders:  .  Referral to Neurology ([place]); Future  .  levETIRAcetam  (KEPPRA ) 500 MG tablet; Take 2 tablets (1,000 mg)  by mouth 2 (two) times daily    Herpes  Patient reports recurrent history of herpes episodes, requiring prophylactic medication.  However patient is only on acyclovir  400 mg once daily, she reports this has been the dose she has been on for the last 3 years, d/w pt that dose usually is twice daily.  Will refill at this time at her past dose, however we will continue to investigate per review of prior charts to determine why dose was reduced.  Orders:  .  acyclovir  (ZOVIRAX ) 400 MG tablet; Take 1 tablet (400 mg) by mouth daily Take 1 tablet every day by oral route.    Anxiety and depression  Chronic.  Per review of prior chart, medications of Prozac , Seroquel  noted.  Patient reports that she has stopped all medications since 04/2023.  Patient reports she did not like how the medications made her feel.  Patient would like to start fresh and would like a comprehensive evaluation prior to new medication initiation, urgent psychiatry referral provided.  Orders:  .  Referral to Adult Psychiatry & Behavioral Health ([place]); Future    Encounter for contraceptive management, unspecified type  Patient has Nexplanon  in place, states she had it prior to her incarceration, could be 3 to 4 years, advised OB/GYN evaluation to discuss further management.       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  Patient would like to discuss her past care with a new neurosurgeon at this time, will provide referral  Orders:  .  Referral to Neurosurgery ([place]); Future    Eye exam, routine  Reports she is due for eye exam  Orders:  .  Referral to Ophthalmology - EXTERNAL; Future    Hearing loss, unspecified hearing loss type, unspecified laterality  Patient reports chronic hearing loss, requiring hearing aids.  Requesting referral to obtain hearing aid, audiology referral  provided  Orders:  .  Ambulatory referral to Audiology; Future    Unsteadiness  See  plan above  Orders:  .  Ambulatory referral to Physical Medicine Rehab; Future

## 2023-07-17 ENCOUNTER — Telehealth (INDEPENDENT_AMBULATORY_CARE_PROVIDER_SITE_OTHER): Payer: Self-pay | Admitting: Family Medicine

## 2023-07-17 ENCOUNTER — Telehealth: Payer: Self-pay | Admitting: Family Medicine

## 2023-07-17 ENCOUNTER — Encounter (INDEPENDENT_AMBULATORY_CARE_PROVIDER_SITE_OTHER): Payer: Self-pay | Admitting: Family Medicine

## 2023-07-17 NOTE — Telephone Encounter (Signed)
 Copied from CRM #[id number]. Topic: Appointment Scheduling - Schedule Appointment  >> Jul 17, 2023  9:30 AM Demetrius S wrote:  Foster, Kayla KAYLAN called about Appointment Scheduling - Schedule Appointment.  Additional details:  Emelda Therapist, Music) called stating she was in the office with this patient yesterday and received a call today but was unsure if the call was for this patient or another Patient with-in the Group Home.  No message was left due to the phone service she has but if needed Siri can be reached back on the number that was called - [phone]

## 2023-07-17 NOTE — Telephone Encounter (Signed)
 Late note entry-     Called primary # to let pt know about upcoming neurology appt, unable to leave voicemail.  Reviewed disclosure form on record, it was recommended to use MyChart, however that is inactive.  Under preferred contacts, Ginger Lilton listed (temporary guardian).  Called to discuss upcoming neurology appointment.  Mrs. Lilton is the appointed temporary guardian of the patient at this time, has affidavit for this.  Reports ongoing mental health concerns for patient.  States difficulty ambulation started after back surgery.  She stated that she will let resident manager know about upcoming neurology appointment.

## 2023-07-20 NOTE — Telephone Encounter (Signed)
 Addressed, see alternative message in EMR

## 2023-07-31 ENCOUNTER — Other Ambulatory Visit (INDEPENDENT_AMBULATORY_CARE_PROVIDER_SITE_OTHER): Payer: Self-pay | Admitting: Family Medicine

## 2023-07-31 DIAGNOSIS — B009 Herpesviral infection, unspecified: Secondary | ICD-10-CM

## 2023-08-04 ENCOUNTER — Ambulatory Visit: Payer: 59 | Attending: Neurology | Admitting: Neurology

## 2023-08-04 ENCOUNTER — Encounter: Payer: Self-pay | Admitting: Neurology

## 2023-08-04 DIAGNOSIS — G40909 Epilepsy, unspecified, not intractable, without status epilepticus: Secondary | ICD-10-CM | POA: Insufficient documentation

## 2023-08-04 MED ORDER — LEVETIRACETAM 1000 MG PO TABS
1000.0000 mg | ORAL_TABLET | Freq: Two times a day (BID) | ORAL | 5 refills | Status: AC
Start: 2023-08-04 — End: ?

## 2023-08-04 NOTE — Progress Notes (Signed)
 Subjective:      Patient ID: Kayla Foster is a 33 y.o. female.    HPI    The patient is a pleasant 33 years old right-handed morbidly obese Caucasian female who is here to establish a new neurologist for continued care of seizure disorder.  She is recommended by staff from group home where she has been living for the last 3 months.  The patient reports having 1 possible seizure during sleep when she woke up with generalized body weakness and soreness.  She has been seen at [facility] in August 2024 and the dose of Keppra  was increased to 1000 mg twice daily.  She believes with the current dose, the seizures seems to have improved significantly, she used to get many a month but now she has not had any in the last few months.  She does not remember having taken any other antiseizure medication in the past.  She believes that her seizures started 10 years ago, they were more frequent in the past and now with the increased dose of levetiracetam .  She describes the seizures as 1 onset of generalized tonic-clonic activity sometimes waking up with sore muscles and foaming in the mouth.    Allergies[1]    The following portions of the patient's history were reviewed and updated as appropriate: allergies, current medications, past family history, past medical history, past social history, past surgical history, and problem list.    Medical History[2]    Medications Ordered Prior to Encounter[3]    Review of Systems   Constitutional:  Positive for activity change and fatigue.   HENT: Negative.     Eyes: Negative.    Respiratory: Negative.     Cardiovascular: Negative.    Gastrointestinal: Negative.    Endocrine: Negative.    Genitourinary: Negative.    Musculoskeletal:  Positive for arthralgias, back pain and gait problem.   Skin: Negative.    Allergic/Immunologic: Negative.    Neurological:  Positive for tremors, seizures and weakness.   Hematological: Negative.    Psychiatric/Behavioral:  Positive for behavioral problems,  decreased concentration and sleep disturbance. The patient is nervous/anxious.      Family History[4]      Objective:     Vitals:    08/04/23 1229   BP: 133/89   BP Site: Right arm   Patient Position: Sitting   Cuff Size: Large   Pulse: (!) 112   Resp: 16   Temp: 97.6 F (36.4 C)   TempSrc: Temporal   SpO2: 99%   Weight: 79.4 kg (175 lb)   Height: 1.6 m (5' 3)     Neurological Exam    Mental Status: The patient was awake, alert, and oriented. Conversation   was appropriate. Speech was fluent, and the patient followed commands   consistently.  Cranial Nerves: Pupils were equally round and reactive to light. Extraocular  movements were intact, without nystagmus. The patient's face was symmetric,   and facial sensation was intact. Tongue and palate were midline.   Motor Exam: The patient had full strength throughout. Tone and bulk appeared   normal. There were no abnormal movements or tremor noted.   Sensation: Light touch, pinprick, and joint position sense were intact.   Coordination: Finger-to-nose and heel-to-shin testing was intact   without dysmetria. Romberg testing was negative.   Gait: Normal, with normal tandem walking.   Deep Tendon Reflexes: 2+ and symmetric throughout.   Toes were downgoing.    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 Seizure disorder  G40.909 Referral to Neurology ([place])          Plan:       The patient will return in follow-up as outlined above. If there are any new neurologic symptoms or worsening of current symptoms, the patient is instructed to contact us  by telephone.    Lynnie Bring, MD  Neurology, Neurophysiology  [facility]               [1] No Known Allergies  [2]   Past Medical History:  Diagnosis Date   . Herpes    . Seizures    [3]   Current Outpatient Medications on File Prior to Visit   Medication Sig Dispense Refill   . acyclovir  (ZOVIRAX ) 400 MG tablet Take 1 tablet (400 mg) by mouth daily Take 1 tablet every day by oral route. 30 tablet 0   . FLUOXETINE  HCL PO Take by mouth     . hydrOXYzine (ATARAX) 50 MG tablet Take 1 tablet (50 mg) by mouth 3 (three) times daily as needed     . levETIRAcetam  (KEPPRA ) 500 MG tablet Take 2 tablets (1,000 mg) by mouth 2 (two) times daily (Patient taking differently: Take 2 tablets (1,000 mg) by mouth 2 (two) times daily Patient states that she takes 1 tablet BID daily) 60 tablet 2   . ondansetron  (ZOFRAN -ODT) 4 MG disintegrating tablet Take 1 tablet (4 mg) by mouth every 6 (six) hours as needed for Nausea 8 tablet 0     No current facility-administered medications on file prior to visit.   [4] No family history on file.

## 2023-08-17 ENCOUNTER — Ambulatory Visit: Payer: 59 | Admitting: Student in an Organized Health Care Education/Training Program

## 2023-08-18 ENCOUNTER — Other Ambulatory Visit (INDEPENDENT_AMBULATORY_CARE_PROVIDER_SITE_OTHER): Payer: Self-pay | Admitting: Family Medicine

## 2023-08-18 DIAGNOSIS — B009 Herpesviral infection, unspecified: Secondary | ICD-10-CM

## 2023-08-25 NOTE — Telephone Encounter (Signed)
 Last refill benztropine not on med list. Last office visit note Patient self discontinued all of her medications including Prozac , benztropine, quetiapine .  Reports that she will not be taking those medications and she wants to start fresh.  Benztropine rx previously for ?tremors    hydrOXYzine: Historical, Acyclovir : 07/16/23    Last o/v 07/16/23    Appt. None  Pended for doctor review and approval

## 2023-08-26 ENCOUNTER — Other Ambulatory Visit (INDEPENDENT_AMBULATORY_CARE_PROVIDER_SITE_OTHER): Payer: Self-pay | Admitting: Family Medicine

## 2023-08-26 ENCOUNTER — Telehealth (INDEPENDENT_AMBULATORY_CARE_PROVIDER_SITE_OTHER): Payer: Self-pay | Admitting: Family Medicine

## 2023-08-26 NOTE — Telephone Encounter (Signed)
 Pt not taking Benztropine, will need to discuss at follow up appt.   Will refill hydroxyzine PRN anxiety TID  Will refill acyclovir , per review of EMR on ppx once daily, however will d/w pt at follow up appt that usually med is BID so if considering [place] ppx. Will have RN reach out to schedule follow up appt.

## 2023-08-26 NOTE — Telephone Encounter (Addendum)
 3/12 MA called pt to schedule for a f/u appt.   Phone no listed [phone] belongs to the resident manager, Siri Highland. MA spoke to her and was told patient is at a nursing home and pt can schedule for an appointment only when the resident manager is available since she has to come with the patient. MA informed since resident manager is not listed on pt disclosure form, not able to disclose. Siri said she will call back the office along with the patient and wants to have her name listed on the patient disclosure as she is the point of contact.     ----- Message from Vince GORMAN Capers, DO sent at 08/26/2023  9:41 AM EDT -----  Regarding: needs follow up appt  Hi,    Can we call pt to make follow up appt w/ myself in the next 1 mo?    Thanks,   VC

## 2023-08-27 NOTE — Telephone Encounter (Signed)
 Patient will need to schedule an appt wit Dr. Graciela Husbands. MA attempted to contact patient to schedule appt. Please see telephone encounter from 08/26/23.

## 2023-09-01 ENCOUNTER — Ambulatory Visit (INDEPENDENT_AMBULATORY_CARE_PROVIDER_SITE_OTHER): Payer: 59 | Admitting: Psychiatry

## 2023-09-01 ENCOUNTER — Encounter (INDEPENDENT_AMBULATORY_CARE_PROVIDER_SITE_OTHER): Payer: Self-pay | Admitting: Psychiatry

## 2023-09-01 VITALS — BP 129/93 | HR 104 | Temp 98.2°F | Resp 17 | Wt 166.4 lb

## 2023-09-01 DIAGNOSIS — F339 Major depressive disorder, recurrent, unspecified: Secondary | ICD-10-CM

## 2023-09-01 DIAGNOSIS — F609 Personality disorder, unspecified: Secondary | ICD-10-CM

## 2023-09-01 DIAGNOSIS — F4310 Post-traumatic stress disorder, unspecified: Secondary | ICD-10-CM

## 2023-09-01 DIAGNOSIS — F411 Generalized anxiety disorder: Secondary | ICD-10-CM

## 2023-09-01 MED ORDER — LURASIDONE HCL 40 MG PO TABS
40.0000 mg | ORAL_TABLET | Freq: Every day | ORAL | 2 refills | Status: DC
Start: 2023-09-01 — End: 2023-09-28

## 2023-09-01 MED ORDER — BUSPIRONE HCL 15 MG PO TABS
ORAL_TABLET | ORAL | 2 refills | Status: AC
Start: 2023-09-01 — End: ?

## 2023-09-01 MED ORDER — FLUOXETINE HCL 20 MG PO CAPS
20.0000 mg | ORAL_CAPSULE | Freq: Every day | ORAL | 2 refills | Status: AC
Start: 2023-09-01 — End: ?

## 2023-09-01 NOTE — Progress Notes (Signed)
 Met with patient to obtain vitals. Verified name and date of birth. Reviewed medications, allergies, and pharmacy.
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
## 2023-09-10 ENCOUNTER — Ambulatory Visit (INDEPENDENT_AMBULATORY_CARE_PROVIDER_SITE_OTHER)

## 2023-09-16 ENCOUNTER — Ambulatory Visit (INDEPENDENT_AMBULATORY_CARE_PROVIDER_SITE_OTHER)

## 2023-09-24 ENCOUNTER — Telehealth (INDEPENDENT_AMBULATORY_CARE_PROVIDER_SITE_OTHER): Payer: Self-pay | Admitting: Family Medicine

## 2023-09-24 NOTE — Telephone Encounter (Addendum)
 MA called pt on 3/12 and resident manager stated will call the office to schedule but didn't hear back from them.   4/10 MA f/u up and called back pt and spoke with Kayla Foster, resident manager to schedule appt. Scheduled pt for 4/17 at 1:00PM and resident manager informed she will be coming along with the patient and stated pt also has concerns about her menstrual cycle and wants to address at the visit. Kayla Foster is the point of contact, informed her to include her on the pt disclosure form as not listed when she comes to the appointment. Kayla Foster verbalized understanding and had no further questions.         ----- Message from Kalvin Orf, DO sent at 08/26/2023  9:41 AM EDT -----  Regarding: needs follow up appt  Hi,    Can we call pt to make follow up appt w/ myself in the next 1 mo?    Thanks,   VC

## 2023-09-26 ENCOUNTER — Emergency Department
Admission: EM | Admit: 2023-09-26 | Discharge: 2023-09-26 | Disposition: A | Discharge status: Home / Self Care | Attending: Emergency Medicine | Admitting: Emergency Medicine

## 2023-09-26 DIAGNOSIS — R531 Weakness: Secondary | ICD-10-CM

## 2023-09-26 DIAGNOSIS — R5383 Other fatigue: Secondary | ICD-10-CM | POA: Insufficient documentation

## 2023-09-26 LAB — LAB USE ONLY - CBC WITH DIFFERENTIAL
Absolute Basophils: 0.11 10*3/uL — ABNORMAL HIGH (ref 0.00–0.08)
Absolute Eosinophils: 0.27 10*3/uL (ref 0.00–0.44)
Absolute Immature Granulocytes: 0.01 10*3/uL (ref 0.00–0.07)
Absolute Lymphocytes: 1.19 10*3/uL (ref 0.42–3.22)
Absolute Monocytes: 0.63 10*3/uL (ref 0.21–0.85)
Absolute Neutrophils: 5.05 10*3/uL (ref 1.10–6.33)
Absolute nRBC: 0 10*3/uL (ref <=0.00)
Basophils %: 1.5 %
Eosinophils %: 3.7 %
Hematocrit: 40.5 % (ref 34.7–43.7)
Hemoglobin: 14.6 g/dL (ref 11.4–14.8)
Immature Granulocytes %: 0.1 %
Lymphocytes %: 16.4 %
MCH: 29.4 pg (ref 25.1–33.5)
MCHC: 36 g/dL — ABNORMAL HIGH (ref 31.5–35.8)
MCV: 81.5 fL (ref 78.0–96.0)
MPV: 10.3 fL (ref 8.9–12.5)
Monocytes %: 8.7 %
Neutrophils %: 69.6 %
Platelet Count: 252 10*3/uL (ref 142–346)
Preliminary Absolute Neutrophil Count: 5.05 10*3/uL (ref 1.10–6.33)
RBC: 4.97 10*6/uL (ref 3.90–5.10)
RDW: 13 % (ref 11–15)
WBC: 7.26 10*3/uL (ref 3.10–9.50)
nRBC %: 0 /100{WBCs} (ref <=0.0)

## 2023-09-26 LAB — COMPREHENSIVE METABOLIC PANEL
ALT: 34 U/L (ref <=55)
AST (SGOT): 40 U/L (ref <=41)
Albumin/Globulin Ratio: 1.3 (ref 0.9–2.2)
Albumin: 4 g/dL (ref 3.5–5.0)
Alkaline Phosphatase: 116 U/L (ref 37–117)
Anion Gap: 10 (ref 5.0–15.0)
BUN: 11 mg/dL (ref 7–21)
Bilirubin, Total: 0.4 mg/dL (ref 0.2–1.2)
CO2: 19 meq/L (ref 17–29)
Calcium: 8.7 mg/dL (ref 8.5–10.5)
Chloride: 110 meq/L (ref 99–111)
Creatinine: 0.5 mg/dL (ref 0.4–1.0)
GFR: >60 mL/min/{1.73_m2} (ref >=60.0)
Globulin: 3.2 g/dL (ref 2.0–3.6)
Glucose: 92 mg/dL (ref 70–100)
Potassium: 3.8 meq/L (ref 3.5–5.3)
Protein, Total: 7.2 g/dL (ref 6.0–8.3)
Sodium: 139 meq/L (ref 135–145)

## 2023-09-26 LAB — URINALYSIS WITH REFLEX TO MICROSCOPIC EXAM - REFLEX TO CULTURE
Urine Bilirubin: NEGATIVE
Urine Glucose: NEGATIVE
Urine Ketones: NEGATIVE mg/dL
Urine Nitrite: NEGATIVE
Urine Protein: NEGATIVE
Urine Specific Gravity: 1.012 (ref 1.001–1.035)
Urine Urobilinogen: NORMAL mg/dL (ref 0.2–2.0)
Urine pH: 6 (ref 5.0–8.0)

## 2023-09-26 LAB — ECG 12-LEAD
Atrial Rate: 100 {beats}/min
IHS MUSE NARRATIVE AND IMPRESSION: NORMAL
P Axis: 42 degrees
P-R Interval: 138 ms
Q-T Interval: 346 ms
QRS Duration: 76 ms
QTC Calculation (Bezet): 446 ms
R Axis: -9 degrees
T Axis: 15 degrees
Ventricular Rate: 100 {beats}/min

## 2023-09-26 LAB — LAB USE ONLY - URINE GRAY CULTURE HOLD TUBE

## 2023-09-26 MED ORDER — LEVETIRACETAM 500 MG PO TABS
1000.0000 mg | ORAL_TABLET | Freq: Once | ORAL | Status: AC
Start: 2023-09-26 — End: 2023-09-26
  Administered 2023-09-26: 1000 mg via ORAL
  Filled 2023-09-26: qty 1
  Filled 2023-09-26: qty 2

## 2023-09-26 NOTE — Discharge Instructions (Signed)
 Dear Ms. Samaras:    Thank you for choosing the [facility], the premier emergency department in the [place] area.  I hope your visit today was EXCELLENT. You will receive a survey via text message that will give you the opportunity to provide feedback to your team about your visit. Please do not hesitate to reach out with any questions!    Specific instructions for your visit today:    IF YOU DO NOT CONTINUE TO IMPROVE OR YOUR CONDITION WORSENS, PLEASE CONTACT YOUR DOCTOR OR RETURN IMMEDIATELY TO THE EMERGENCY DEPARTMENT.    Sincerely,  Clista Dance, MD  Attending Emergency Physician  [facility] Emergency Department      OBTAINING A PRIMARY CARE APPOINTMENT    Primary care physicians (PCPs, also known as primary care doctors) are either internists or family medicine doctors. Both types of PCPs focus on health promotion, disease prevention, patient education and counseling, and treatment of acute and chronic medical conditions.    If you need a primary care doctor, please call the below number and ask who is receiving new patients.     [facility]  Telephone:  [phone]  [email or web address]    DOCTOR REFERRALS  Call [phone] (available 24 hours a day, 7 days a week) if you need any further referrals and we can help you find a primary care doctor or specialist.  Referral services are also available online at:  [email or web address] Please reach out to our ED Care Management Specialist, Kacie Oneal, if you encounter any difficulty in scheduling your [facility] follow up appointment.  Kacie Oneal can be reached at [phone] or [email or web address].    YOUR CONTACT INFORMATION  Before leaving please check with registration to make sure we have an up-to-date contact number.  You can call registration at [phone] to update your information.  For questions about your hospital bill, please call [phone].  For questions about your Emergency  Dept Physician bill please call [phone].      FREE HEALTH SERVICES  If you need help with health or social services, please call 2-1-1 for a free referral to resources in your area.  2-1-1 is a free service connecting people with information on health insurance, free clinics, pregnancy, mental health, dental care, food assistance, housing, and substance abuse counseling.  Also, available online at:  http://www.[facility].org    ORTHOPEDIC INJURY   Please know that significant injuries can exist even when an initial x-ray is read as normal or negative.  This can occur because some fractures (broken bones) are not initially visible on x-rays.  For this reason, close outpatient follow-up with your primary care doctor or bone specialist (orthopedist) is required.    MEDICATIONS AND FOLLOWUP  Please be aware that some prescription medications can cause drowsiness.  Use caution when driving or operating machinery.    The examination and treatment you have received in our Emergency Department is provided on an emergency basis, and is not intended to be a substitute for your primary care physician.  It is important that your doctor checks you again and that you report any new or remaining problems at that time.      ASSISTANCE WITH INSURANCE    Affordable Care Act  [facility])  Call to start or finish an application, compare plans, enroll or ask a question.  [phone]  TTY: [phone]  Web:  Healthcare.gov    Help Enrolling in [facility]  [employer] [place]   [phone] (TOLL-FREE)  830-671-6251 (TTY)  Web:  Http://www.coverva.org    Local Help Enrolling in the [employer]  [place]  Family Service  725-294-3503 (MAIN)  Email:  health-help@nvfs .org  Web:  BlackjackMyths.is  Address:  [address] [place], [place] [address]    SEDATING MEDICATIONS  Sedating medications include strong pain medications (e.g. narcotics), muscle relaxers, benzodiazepines (used for anxiety and as muscle relaxers),  Benadryl/diphenhydramine and other antihistamines for allergic reactions/itching, and other medications.  If you are unsure if you have received a sedating medication, please ask your physician or nurse.  If you received a sedating medication: DO NOT drive a car. DO NOT operate machinery. DO NOT perform jobs where you need to be alert.  DO NOT drink alcoholic beverages while taking this medicine.     If you get dizzy, sit or lie down at the first signs. Be careful going up and down stairs.  Be extra careful to prevent falls.     Never give this medicine to others.     Keep this medicine out of reach of children.     Do not take or save old medicines. Throw them away when outdated.     Keep all medicines in a cool, dry place. DO NOT keep them in your bathroom medicine cabinet or in a cabinet above the stove.    MEDICATION REFILLS  Please be aware that we cannot refill any prescriptions through the ER. If you need further treatment from what is provided at your ER visit, please follow up with your primary care doctor or your pain management specialist.    FREESTANDING EMERGENCY DEPARTMENTS OF [facility]  Did you know [facility] has two freestanding ERs located just a few miles away?  [place] [facility] [place] and [place] [facility] [place] have short wait times, easy free parking directly in front of the building and top patient satisfaction scores - and the same Board Certified Emergency Medicine doctors as [facility].

## 2023-09-26 NOTE — ED Notes (Signed)
 Bed: S 09  Expected date: 09/26/23  Expected time:   Means of arrival: [id number] - [place]  Comments:
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
## 2023-09-28 ENCOUNTER — Telehealth (INDEPENDENT_AMBULATORY_CARE_PROVIDER_SITE_OTHER): Admitting: Psychiatry

## 2023-09-28 ENCOUNTER — Encounter (INDEPENDENT_AMBULATORY_CARE_PROVIDER_SITE_OTHER): Payer: Self-pay | Admitting: Psychiatry

## 2023-09-28 ENCOUNTER — Other Ambulatory Visit (INDEPENDENT_AMBULATORY_CARE_PROVIDER_SITE_OTHER): Payer: Self-pay | Admitting: Family Medicine

## 2023-09-28 DIAGNOSIS — F411 Generalized anxiety disorder: Secondary | ICD-10-CM

## 2023-09-28 DIAGNOSIS — F339 Major depressive disorder, recurrent, unspecified: Secondary | ICD-10-CM

## 2023-09-28 DIAGNOSIS — F609 Personality disorder, unspecified: Secondary | ICD-10-CM

## 2023-09-28 DIAGNOSIS — F4310 Post-traumatic stress disorder, unspecified: Secondary | ICD-10-CM

## 2023-09-28 MED ORDER — QUETIAPINE FUMARATE ER 50 MG PO TB24
ORAL_TABLET | ORAL | 0 refills | Status: DC
Start: 2023-09-28 — End: 2024-04-18

## 2023-09-28 MED ORDER — QUETIAPINE FUMARATE ER 150 MG PO TB24
ORAL_TABLET | ORAL | 1 refills | Status: AC
Start: 2023-09-28 — End: ?
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
## 2023-09-29 NOTE — Telephone Encounter (Signed)
 Requested Prescriptions     Pending Prescriptions Disp Refills    benztropine (COGENTIN) 1 MG tablet [Pharmacy Med Name: BENZTROPINE MES 1 MG TABLET] 60 tablet 0     Sig: TAKE ONE TABLET BY MOUTH 2 TIMES A DAY FOR MOVEMENT    Last o/v: 07/16/2023   Upcoming Appt: 10/01/23   Last refill: None

## 2023-10-01 ENCOUNTER — Encounter (INDEPENDENT_AMBULATORY_CARE_PROVIDER_SITE_OTHER): Payer: Self-pay | Admitting: Family Medicine

## 2023-10-01 ENCOUNTER — Ambulatory Visit (INDEPENDENT_AMBULATORY_CARE_PROVIDER_SITE_OTHER): Admitting: Family Medicine

## 2023-10-01 VITALS — BP 119/85 | HR 96 | Temp 98.0°F | Ht 63.0 in | Wt 175.0 lb

## 2023-10-01 DIAGNOSIS — F419 Anxiety disorder, unspecified: Secondary | ICD-10-CM

## 2023-10-01 DIAGNOSIS — B009 Herpesviral infection, unspecified: Secondary | ICD-10-CM

## 2023-10-01 DIAGNOSIS — Z309 Encounter for contraceptive management, unspecified: Secondary | ICD-10-CM

## 2023-10-01 DIAGNOSIS — F32A Depression, unspecified: Secondary | ICD-10-CM

## 2023-10-01 DIAGNOSIS — G40909 Epilepsy, unspecified, not intractable, without status epilepticus: Secondary | ICD-10-CM

## 2023-10-01 DIAGNOSIS — R682 Dry mouth, unspecified: Secondary | ICD-10-CM

## 2023-10-01 LAB — HEMOGLOBIN A1C
Average Estimated Glucose: 96.8 mg/dL
Hemoglobin A1C: 5 % (ref 4.6–5.6)

## 2023-10-01 MED ORDER — ACYCLOVIR 400 MG PO TABS
400.0000 mg | ORAL_TABLET | Freq: Two times a day (BID) | ORAL | 3 refills | Status: DC
Start: 2023-10-01 — End: 2024-03-01

## 2023-10-01 NOTE — Progress Notes (Signed)
 [facility]   OFFICE VISIT                HPI     Chief Complaint   Patient presents with    Follow-up    Menstrual Problem      HPI    33 yo female w PMH depression/anxiety, seizure disorder, genital herpes, schwannoma, laminectomy, who presents for follow-up.    Patient was seen in the ER on 09/26/2023 for concerns of other fatigue, initially went to ER for possible seizure-like activity for concerns of palpitations/"pre-seizure sensation", no aura symptoms neurologically intact on exam, was given home dose of Keppra  and deemed stable for discharge with PCP follow-up. CBC/UA/CMP unremarkable.  Patient reports overall low symptoms she was initially feeling at that time have improved.    Today, patient reports she is doing overall well, states she has been working with psychiatry, has been helping her adjust her medications.  She reports recently they added Seroquel  which has been very helpful with her sleep.    Patient would like to establish with OB/GYN for Nexplanon  replacement, states that she is getting her menses/irregular spotting, which previously had gone away when the Nexplanon  was initially placed.  Patient believes the Nexplanon  has been in place for the last ?3-4 years.    Patient reports since the started adding medications, she has noticed a dry mouth.  She reports that she feels like she needs to moisten what in the with water.  No polyuria, CP, SOB, confusion, lethargy, abd pain, n/v/d, f/c.     Review of Systems   Constitutional:  Negative for chills and fever.   Eyes:  Negative for visual disturbance.   Respiratory:  Negative for cough and shortness of breath.    Cardiovascular:  Negative for chest pain, palpitations and leg swelling.   Gastrointestinal:  Negative for abdominal pain, constipation, diarrhea, nausea and vomiting.   Musculoskeletal:  Positive for gait problem (chronic). Negative for back pain and neck pain.   Skin:  Negative for rash.   Neurological:  Positive for tremors  (chronic). Negative for dizziness, syncope, facial asymmetry, weakness, light-headedness, numbness and headaches.      Vital Signs   BP 119/85 (BP Site: Right arm, Patient Position: Sitting, Cuff Size: Medium)   Pulse 96   Temp 98 F (36.7 C) (Temporal)   Ht 1.6 m (5\' 3" )   Wt 79.4 kg (175 lb)   LMP 09/21/2023 (Approximate)   SpO2 98%   BMI 31.00 kg/m     Physical Exam   Physical Exam    Vitals reviewed.   Constitutional:       General: She is not in acute distress.     Appearance: Normal appearance. She is not ill-appearing or toxic-appearing.   HENT:      Head: Normocephalic.      Nose: Nose normal.      Mouth/Throat:      Mouth: Mucous membranes are moist.      Pharynx: Oropharynx is clear. No oropharyngeal exudate or posterior oropharyngeal erythema.   Eyes:      General: No scleral icterus.     Extraocular Movements: Extraocular movements intact.      Conjunctiva/sclera: Conjunctivae normal.      Pupils: Pupils are equal, round, and reactive to light.   Cardiovascular:      Rate and Rhythm: Normal rate and regular rhythm.      Heart sounds: No murmur heard.  Pulmonary:      Effort:  Pulmonary effort is normal. No respiratory distress.      Breath sounds: Normal breath sounds. No stridor. No wheezing.   Abdominal:      General: Abdomen is flat.      Palpations: Abdomen is soft.      Tenderness: There is no abdominal tenderness.   Musculoskeletal:      Cervical back: Normal range of motion and neck supple. No tenderness.      Right lower leg: No edema.      Left lower leg: No edema.   Skin:     General: Skin is warm.   Neurological:      Mental Status: She is alert and oriented to person, place, and time. Mental status is at baseline. Currently in wheelchair.   Assessment/Plan     Assessment & Plan  Seizure disorder (CMS/HCC)  Chronic.  On Keppra  1000 mg twice daily.  Established with neurology.  Unclear if recent episode was a complete seizure, since symptoms subsided and only preseizure symptoms noted.   Advised patient to follow-up with neurology       Herpes  Discussed with patient that she has been on 400 mg once daily for the last few years in regards to prophylaxis for genital herpes.  Patient reports last exacerbation was a few years ago.  Discussed with patient trialing off medication since patient not on suppressive dose even.  Patient declines, wants to remain on suppressive dose for herpes at this time.  Discussed with patient that she is possibly on valacyclovir which is usually once daily dosing.  Patient would like to remain on acyclovir , will change dosing to suppressive therapy dose at twice daily dosing.  CMP on 09/26/2023 within normal limits.  Reassess annually.  Orders:    acyclovir  (ZOVIRAX ) 400 MG tablet; Take 1 tablet (400 mg) by mouth 2 (two) times daily    Anxiety and depression  Chronic.  Stable.  Followed by psychiatry       Encounter for contraceptive management, unspecified type  Nexplanon  in left upper arm, due for replacement, will refer to OB/GYN  Orders:    Referral to Obstetrics/Gynecology ([place]); Future    Dry mouth  Possible side effects related to new antipsychiatric medications.  CMP/CBC on 4/12 within normal limits.  Also obtain A1c to assess for possible diabetes.  Patient discussed with psychiatry in regards to possible side effect of medications.  Orders:    Hemoglobin A1C; Future

## 2023-10-02 ENCOUNTER — Ambulatory Visit (INDEPENDENT_AMBULATORY_CARE_PROVIDER_SITE_OTHER): Payer: Self-pay | Admitting: Family Medicine

## 2023-10-09 ENCOUNTER — Telehealth: Payer: Self-pay | Admitting: Family Medicine

## 2023-10-09 NOTE — Telephone Encounter (Signed)
 4/25 Called pt to notify of A1c result and convey Dr.Chauhan notes. Call was answered by  front desk rep from [facility]. Resident Manager Grayson Leaf, listed as the poc for pt. Rep stated will call the office back. MA did not disclose information about patient.

## 2023-10-09 NOTE — Telephone Encounter (Signed)
 Copied from CRM [id number]. Topic: Clinical Support - Speak With Nurse  >> Oct 09, 2023 12:20 PM Maryella Smothers wrote:  Foster, Kayla KAYLAN called about Clinical Support - Speak With Nurse.  Additional details:  [name] from [facility] is returning a call to the nurse regarding pt's lab results. Please callback when avail. Ph. [phone]. Thank you

## 2023-10-09 NOTE — Telephone Encounter (Signed)
 4/25 Spoke to pt and notify her A1c result. Pt verbalized understanding. Also, informed pt to reach out to her psychiatrist reg her dry mouth as possible side effect, per Dr.Chauhan's notes. Pt expressed understanding and had no further questions.

## 2023-10-20 ENCOUNTER — Ambulatory Visit (INDEPENDENT_AMBULATORY_CARE_PROVIDER_SITE_OTHER): Admitting: Residents

## 2023-10-20 ENCOUNTER — Ambulatory Visit (INDEPENDENT_AMBULATORY_CARE_PROVIDER_SITE_OTHER): Admitting: Obstetrics and Gynecology

## 2023-10-21 ENCOUNTER — Encounter (INDEPENDENT_AMBULATORY_CARE_PROVIDER_SITE_OTHER): Payer: Self-pay | Admitting: Obstetrics and Gynecology

## 2023-10-21 ENCOUNTER — Ambulatory Visit (INDEPENDENT_AMBULATORY_CARE_PROVIDER_SITE_OTHER): Admitting: Obstetrics and Gynecology

## 2023-10-21 VITALS — BP 124/89 | HR 106 | Temp 98.3°F | Wt 172.8 lb

## 2023-10-21 DIAGNOSIS — Z309 Encounter for contraceptive management, unspecified: Secondary | ICD-10-CM

## 2023-10-21 DIAGNOSIS — Z3046 Encounter for surveillance of implantable subdermal contraceptive: Secondary | ICD-10-CM

## 2023-10-21 DIAGNOSIS — Z3202 Encounter for pregnancy test, result negative: Secondary | ICD-10-CM

## 2023-10-21 LAB — POCT PREGNANCY TEST, URINE HCG: POCT Pregnancy HCG Test, UR: NEGATIVE

## 2023-10-21 MED ORDER — ETONOGESTREL 68 MG SC IMPL
1.0000 | DRUG_IMPLANT | Freq: Once | SUBCUTANEOUS | Status: AC
Start: 2023-10-21 — End: 2023-10-21
  Administered 2023-10-21: 1 via SUBCUTANEOUS

## 2023-10-21 NOTE — Progress Notes (Signed)
 UNIVERSAL PROTOCOL:  TIMEOUT    Procedure: Nexplanon  Removal and Insertion Date: 10/21/2023   All correct equipment/supplies are present  And ready for use prior to the procedure Yes   Patient stated name and date of birth Yes   Patient verbally stated the procedure (including the site and side) to be completed. Yes   Informed Consent reviewed and signed consistent with procedure, side, and site information Yes   Practitioner (MD/DO/DPM/NP/PA/RN) performing the procedure marked the site as indicated Yes   Asked the patient for any known drug allergies, including anesthetics and latex. Yes   Labels for specimens are prepared with the following information:  Date specimen collected  Name of patient  MR#  Provider name  Specimen type  and placed on containers in presence of patient Not Available   Verified that patient has had all questions answered. Yes   Method of notification of biopsy results requested by patient Not Available      Curved Hemostat, L1 SS

## 2023-10-21 NOTE — Procedures (Signed)
 [facility]  [address], [address]  [place], [place] [address]           Procedure Note     Name: Erina Hamme  DOB: 1991-02-22  MRN: [id number]    Date of service: 10/21/2023       Procedure: Nexplanon  removal and reinsertion (CPT [phone])  Indication for removal: Expired implant  Urine pregnancy test: negative  Implant: Nexplanon  (Lot # [id number]; Exp: February 2027 )  EBL: minimal           Procedure details:   Prior to the procedure, the patient provided informed written consent via iMED. The Nexplanon  implant was palpated in the patient's left upper arm. The area was prepped and draped in the usual sterile fashion. Local anesthesia was applied with 1% Lidocaine . A small incision was made with the scalpel in the skin overlying the distal aspect of the old Nexplanon . The expired implant was grasped with a hemostat and removed intact without difficulty. It was measured and noted to be 4 cm in length. It was shown to the patient and discarded. The new Nexplanon  device was inserted through the incision without difficulty. The incision was cleaned and steri-strips were applied.  A pressure bandage was placed. The patient tolerated the procedure well. No complications.        - Keep pressure bandage on x 24 hrs.  - Discussed expected bruising.   - Reviewed signs/symptoms of concern, advised to call for any of these.        Glendia LELON Mau, MD

## 2023-10-27 ENCOUNTER — Other Ambulatory Visit (INDEPENDENT_AMBULATORY_CARE_PROVIDER_SITE_OTHER): Payer: Self-pay | Admitting: Family Medicine

## 2023-10-27 ENCOUNTER — Other Ambulatory Visit (INDEPENDENT_AMBULATORY_CARE_PROVIDER_SITE_OTHER): Payer: Self-pay | Admitting: Psychiatry

## 2023-11-10 ENCOUNTER — Ambulatory Visit (INDEPENDENT_AMBULATORY_CARE_PROVIDER_SITE_OTHER): Admitting: Residents

## 2023-11-26 ENCOUNTER — Other Ambulatory Visit (INDEPENDENT_AMBULATORY_CARE_PROVIDER_SITE_OTHER): Payer: Self-pay | Admitting: Family Medicine

## 2023-11-26 ENCOUNTER — Other Ambulatory Visit (INDEPENDENT_AMBULATORY_CARE_PROVIDER_SITE_OTHER): Payer: Self-pay | Admitting: Psychiatry

## 2023-12-25 ENCOUNTER — Other Ambulatory Visit (INDEPENDENT_AMBULATORY_CARE_PROVIDER_SITE_OTHER): Payer: Self-pay | Admitting: Psychiatry

## 2023-12-25 ENCOUNTER — Other Ambulatory Visit (INDEPENDENT_AMBULATORY_CARE_PROVIDER_SITE_OTHER): Payer: Self-pay | Admitting: Family Medicine

## 2023-12-25 DIAGNOSIS — B009 Herpesviral infection, unspecified: Secondary | ICD-10-CM

## 2024-01-05 ENCOUNTER — Other Ambulatory Visit (INDEPENDENT_AMBULATORY_CARE_PROVIDER_SITE_OTHER): Payer: Self-pay | Admitting: Family Medicine

## 2024-01-05 ENCOUNTER — Other Ambulatory Visit (INDEPENDENT_AMBULATORY_CARE_PROVIDER_SITE_OTHER): Payer: Self-pay | Admitting: Psychiatry

## 2024-01-12 ENCOUNTER — Other Ambulatory Visit (INDEPENDENT_AMBULATORY_CARE_PROVIDER_SITE_OTHER): Payer: Self-pay | Admitting: Family Medicine

## 2024-01-18 ENCOUNTER — Other Ambulatory Visit (INDEPENDENT_AMBULATORY_CARE_PROVIDER_SITE_OTHER): Payer: Self-pay | Admitting: Family Medicine

## 2024-01-18 ENCOUNTER — Other Ambulatory Visit (INDEPENDENT_AMBULATORY_CARE_PROVIDER_SITE_OTHER): Payer: Self-pay | Admitting: Psychiatry

## 2024-01-18 DIAGNOSIS — B009 Herpesviral infection, unspecified: Secondary | ICD-10-CM

## 2024-01-25 ENCOUNTER — Other Ambulatory Visit (INDEPENDENT_AMBULATORY_CARE_PROVIDER_SITE_OTHER): Payer: Self-pay | Admitting: Psychiatry

## 2024-01-25 ENCOUNTER — Other Ambulatory Visit (INDEPENDENT_AMBULATORY_CARE_PROVIDER_SITE_OTHER): Payer: Self-pay | Admitting: Family Medicine

## 2024-01-25 DIAGNOSIS — B009 Herpesviral infection, unspecified: Secondary | ICD-10-CM

## 2024-01-28 ENCOUNTER — Telehealth (INDEPENDENT_AMBULATORY_CARE_PROVIDER_SITE_OTHER): Payer: Self-pay | Admitting: Family Medicine

## 2024-01-28 NOTE — Telephone Encounter (Signed)
 Copied from CRM [id number]. Topic: Clinical Support - Prescription Refill  >> Jan 28, 2024 12:44 PM Alyce T wrote:  Bann, Sible KAYLAN called about Clinical Support - Prescription Refill.  Additional details:  Name, strength, directions of requested refill(s):  hydrOXYzine (ATARAX) 50 MG tablet (Order [id number])      How much medication is remaining:   0  Pharmacy to send refill to or patient to pick up rx from office (mark requested pharmacy in BOLD):    [facility] - [place] - [place], [place] - [address] [phone]      Please mark X next to the preferred call back number:    Mobile: There is no such number on file (mobile).   Home: [phone] (home)   Work: @WORKPHONE @       Medication refill request, see above. Thank you   Patient has been informed that medication refill requests should be called in up to one week prior to running out of medication.    Additional Notes: [facility] called stating pt is out of medication and pharmacy is requesting refill from provider     Next Visit: MM/DD/YYYY

## 2024-02-04 ENCOUNTER — Telehealth (INDEPENDENT_AMBULATORY_CARE_PROVIDER_SITE_OTHER): Payer: Self-pay | Admitting: Family Medicine

## 2024-02-04 NOTE — Telephone Encounter (Signed)
 Kayla Foster, Kayla Foster called about Clinical Support - Prescription Refill.  Additional details:    Name, strength, directions of requested refill(s):    hydrOXYzine (ATARAX) 50 MG tablet    How much medication is remaining: 0    Pharmacy to send refill to or patient to pick up rx from office (mark requested pharmacy in BOLD):    [facility] - [place] - [place], [place] - [address]   Phone: [phone]  Fax: [phone]      Please mark X next to the preferred call back number:    Mobile: There is no such number on file (mobile).   Home: [phone] (home)   Work: @WORKPHONE @       Medication refill request, see above. Thank you   Patient has been informed that medication refill requests should be called in up to one week prior to running out of medication.    Additional Notes:    Next Visit: MM/DD/YYYY

## 2024-02-05 NOTE — Telephone Encounter (Addendum)
 Left message for patient to call back and schedule appointment.

## 2024-02-19 ENCOUNTER — Other Ambulatory Visit (INDEPENDENT_AMBULATORY_CARE_PROVIDER_SITE_OTHER): Payer: Self-pay | Admitting: Family Medicine

## 2024-02-19 NOTE — Telephone Encounter (Signed)
 Copied from CRM #[id number]. Topic: Clinical Support - Prescription Refill  >> Feb 19, 2024  1:12 PM Kayla Foster wrote:  Lemanski, Kayla Foster called about Clinical Support - Prescription Refill.  Additional details: representative from group home   Kayla Foster stated it's the 3rd time that RX refill for hydrOXYzine (ATARAX) 50 MG tablet 30 tablet 1 -   Sig - Route: Take 1 tablet (50 mg) by mouth every 8 (eight) hours as needed for Anxiety - Oral   Has been requested.    [facility] - [place] - [place], [place] - [address]   Phone: [phone]  Fax: [phone]    Urgent refill needed.  Please call back [phone]

## 2024-02-24 NOTE — Telephone Encounter (Signed)
 Med prescribed by psychiatry, overdue for follow up appt, has scheduled for 02/26/24

## 2024-02-26 ENCOUNTER — Ambulatory Visit (INDEPENDENT_AMBULATORY_CARE_PROVIDER_SITE_OTHER): Admitting: Family Medicine

## 2024-02-26 ENCOUNTER — Other Ambulatory Visit (INDEPENDENT_AMBULATORY_CARE_PROVIDER_SITE_OTHER): Payer: Self-pay | Admitting: Family Medicine

## 2024-02-26 ENCOUNTER — Ambulatory Visit (INDEPENDENT_AMBULATORY_CARE_PROVIDER_SITE_OTHER): Admitting: Internal Medicine

## 2024-02-26 NOTE — Telephone Encounter (Signed)
 Last ordered: 6 months ago (08/26/2023) by Vince GORMAN Capers, DO       LOV- 4.17.2025 (seizure, anxiety,depression)

## 2024-02-29 ENCOUNTER — Other Ambulatory Visit (INDEPENDENT_AMBULATORY_CARE_PROVIDER_SITE_OTHER): Payer: Self-pay | Admitting: Psychiatry

## 2024-02-29 ENCOUNTER — Other Ambulatory Visit (INDEPENDENT_AMBULATORY_CARE_PROVIDER_SITE_OTHER): Payer: Self-pay | Admitting: Family Medicine

## 2024-02-29 DIAGNOSIS — B009 Herpesviral infection, unspecified: Secondary | ICD-10-CM

## 2024-03-01 ENCOUNTER — Ambulatory Visit (INDEPENDENT_AMBULATORY_CARE_PROVIDER_SITE_OTHER): Admitting: Family Medicine

## 2024-03-01 ENCOUNTER — Encounter (INDEPENDENT_AMBULATORY_CARE_PROVIDER_SITE_OTHER): Payer: Self-pay | Admitting: Family Medicine

## 2024-03-01 VITALS — BP 132/88 | HR 91 | Temp 97.9°F | Ht 63.0 in | Wt 193.8 lb

## 2024-03-01 DIAGNOSIS — F32A Depression, unspecified: Secondary | ICD-10-CM

## 2024-03-01 DIAGNOSIS — G40909 Epilepsy, unspecified, not intractable, without status epilepticus: Secondary | ICD-10-CM

## 2024-03-01 DIAGNOSIS — G8929 Other chronic pain: Secondary | ICD-10-CM

## 2024-03-01 DIAGNOSIS — D3610 Benign neoplasm of peripheral nerves and autonomic nervous system, unspecified: Secondary | ICD-10-CM

## 2024-03-01 DIAGNOSIS — M545 Low back pain, unspecified: Secondary | ICD-10-CM

## 2024-03-01 DIAGNOSIS — R200 Anesthesia of skin: Secondary | ICD-10-CM

## 2024-03-01 DIAGNOSIS — B009 Herpesviral infection, unspecified: Secondary | ICD-10-CM

## 2024-03-01 DIAGNOSIS — F419 Anxiety disorder, unspecified: Secondary | ICD-10-CM

## 2024-03-01 LAB — COMPREHENSIVE METABOLIC PANEL
ALT: 37 U/L (ref <=55)
AST (SGOT): 32 U/L (ref <=41)
Albumin/Globulin Ratio: 1.2 (ref 0.9–2.2)
Albumin: 4.4 g/dL (ref 3.5–5.0)
Alkaline Phosphatase: 133 U/L — ABNORMAL HIGH (ref 37–117)
Anion Gap: 10 (ref 5.0–15.0)
BUN: 9 mg/dL (ref 7–21)
Bilirubin, Total: 0.6 mg/dL (ref 0.2–1.2)
CO2: 24 meq/L (ref 17–29)
Calcium: 9.1 mg/dL (ref 8.5–10.5)
Chloride: 105 meq/L (ref 99–111)
Creatinine: 0.7 mg/dL (ref 0.4–1.0)
GFR: >60 mL/min/1.73 m2 (ref >=60.0)
Globulin: 3.6 g/dL (ref 2.0–3.6)
Glucose: 76 mg/dL (ref 70–100)
Hemolysis Index: 4 {index}
Potassium: 4.2 meq/L (ref 3.5–5.3)
Protein, Total: 8 g/dL (ref 6.0–8.3)
Sodium: 139 meq/L (ref 135–145)

## 2024-03-01 LAB — LAB USE ONLY - CBC WITH DIFFERENTIAL
Absolute Basophils: 0.08 x10 3/uL (ref 0.00–0.08)
Absolute Eosinophils: 0.28 x10 3/uL (ref 0.00–0.44)
Absolute Immature Granulocytes: 0.03 x10 3/uL (ref 0.00–0.07)
Absolute Lymphocytes: 1.49 x10 3/uL (ref 0.42–3.22)
Absolute Monocytes: 0.5 x10 3/uL (ref 0.21–0.85)
Absolute Neutrophils: 4.28 x10 3/uL (ref 1.10–6.33)
Absolute nRBC: 0 x10 3/uL (ref <=0.00)
Basophils %: 1.2 %
Eosinophils %: 4.2 %
Hematocrit: 44.9 % — ABNORMAL HIGH (ref 34.7–43.7)
Hemoglobin: 15.6 g/dL — ABNORMAL HIGH (ref 11.4–14.8)
Immature Granulocytes %: 0.5 %
Lymphocytes %: 22.4 %
MCH: 30 pg (ref 25.1–33.5)
MCHC: 34.7 g/dL (ref 31.5–35.8)
MCV: 86.3 fL (ref 78.0–96.0)
MPV: 10.4 fL (ref 8.9–12.5)
Monocytes %: 7.5 %
Neutrophils %: 64.2 %
Platelet Count: 276 x10 3/uL (ref 142–346)
Preliminary Absolute Neutrophil Count: 4.28 x10 3/uL (ref 1.10–6.33)
RBC: 5.2 x10 6/uL — ABNORMAL HIGH (ref 3.90–5.10)
RDW: 13 % (ref 11–15)
WBC: 6.66 x10 3/uL (ref 3.10–9.50)
nRBC %: 0 /100{WBCs} (ref <=0.0)

## 2024-03-01 LAB — THYROID STIMULATING HORMONE (TSH) WITH REFLEX TO FREE T4: TSH: 1.34 u[IU]/mL (ref 0.35–4.94)

## 2024-03-01 MED ORDER — ACYCLOVIR 400 MG PO TABS: 400.0000 mg | ORAL_TABLET | Freq: Two times a day (BID) | ORAL | 3 refills | Status: DC

## 2024-03-01 NOTE — Progress Notes (Signed)
 [facility]   OFFICE VISIT                HPI     Chief Complaint   Patient presents with    Follow-up    Medication Refill     Pain medication      HPI    33 yo female w PMH depression/anxiety, seizure disorder, genital herpes, schwannoma, laminectomy, who presents for follow-up.     Patient is overdue for follow-up with psychiatry, neurology.  Patient has not seen neurosurgery.    Reports she has been having intermittent low back pain, thought hydroxyzine was for pain relief, has been occurring for >57mo.  Reports nonradiating, no bowel/bladder incontinence, no saddle anesthesia, no focal weakness, no fever/chills.  No falls, injuries, traumas.     Patient reports he has also been having left wrist numbness/left hand numbness that can radiate up the arm intermittently.  She reports this occurs when she first wakes up in the morning.  No other associated symptoms.  She states that has been ongoing for the last 2 to 3 months.  She reports symptoms resolved spontaneously.  No current symptoms.    ROS   Review of Systems   Constitutional:  Negative for chills and fever.   Eyes:  Negative for visual disturbance.   Respiratory:  Negative for cough and shortness of breath.    Cardiovascular:  Negative for chest pain, palpitations and leg swelling.   Gastrointestinal:  Negative for abdominal pain, constipation, diarrhea, nausea and vomiting.   Musculoskeletal:  Positive for back pain and gait problem (Chronic).   Skin:  Negative for rash.   Neurological:  Positive for tremors (chronic) and numbness. Negative for dizziness, seizures, syncope, facial asymmetry, speech difficulty, weakness, light-headedness and headaches.         Vital Signs   BP 132/88   Pulse 91   Temp 97.9 F (36.6 C) (Oral)   Ht 1.6 m (5' 3)   Wt 87.9 kg (193 lb 12.8 oz)   SpO2 97%   BMI 34.33 kg/m   Physical Exam   Physical Exam  Vitals and nursing note reviewed.   Constitutional:       General: She is not in acute distress.      Appearance: Normal appearance. She is not ill-appearing, toxic-appearing or diaphoretic.   HENT:      Head: Normocephalic and atraumatic.      Nose: Nose normal.      Mouth/Throat:      Mouth: Mucous membranes are moist.      Pharynx: Oropharynx is clear.   Eyes:      General: No scleral icterus.     Extraocular Movements: Extraocular movements intact.      Conjunctiva/sclera: Conjunctivae normal.      Pupils: Pupils are equal, round, and reactive to light.   Cardiovascular:      Rate and Rhythm: Normal rate and regular rhythm.      Heart sounds: No murmur heard.  Pulmonary:      Effort: Pulmonary effort is normal. No respiratory distress.      Breath sounds: Normal breath sounds. No stridor. No wheezing.   Abdominal:      Palpations: Abdomen is soft.      Tenderness: There is no abdominal tenderness. There is no guarding or rebound.   Musculoskeletal:         General: Normal range of motion.      Cervical back: Normal range of motion and  neck supple.      Right lower leg: No edema.      Left lower leg: No edema.      Comments: Tight b/l paraspinal TTP. Prior healed scar noted from past surgery.   Skin:     General: Skin is warm.   Neurological:      General: No focal deficit present.      Mental Status: She is alert and oriented to person, place, and time. Mental status is at baseline.      Cranial Nerves: No cranial nerve deficit.      Sensory: No sensory deficit.      Motor: No weakness.      Comments: In wheelchair   Psychiatric:         Mood and Affect: Mood normal.        Assessment/Plan     Assessment & Plan  Herpes  Chronic.  No recent flares, on prophylactic acyclovir .  Tolerating appropriately.  Repeat CMP.  Refilled acyclovir  at this time.  Orders:    Comprehensive Metabolic Panel; Future    acyclovir  (ZOVIRAX ) 400 MG tablet; Take 1 tablet (400 mg) by mouth 2 (two) times daily    Follow Up In Primary Care; Future    Anxiety and depression  Chronic.  Overall, patient reports she is doing well.  Overdue for  psychiatric follow-up, patient was unaware, so we will be planning to schedule accordingly.  Orders:    Referral to Adult Psychiatry & Behavioral Health ([facility]); Future    Follow Up In Primary Care; Future    Seizure disorder (CMS/HCC)  Chronic.  No recent seizures.  Patient due for neurology follow-up, patient to schedule accordingly.  Orders:    Referral to Neurology ([facility]); Future    Follow Up In Primary Care; Future    Chronic bilateral low back pain without sciatica  Patient reports intermittent low back pain, thought hydroxyzine was alleviating her back symptoms.  Patient reports tightness of the low back, nonradiating.  No bowel/bladder incontinence, saddle anesthesia, fever/chills, focal weakness.  Patient with prior schwannoma with lumbar surgery.  Given previous surgery and chronic concerns at this time, advised patient to follow-up with neurosurgery.       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  Patient would like to discuss her past care with a new neurosurgeon at this time, will provide referral   Orders:    Referral to Neurosurgery ([place]); Future    Follow Up In Primary Care; Future    Numbness of upper extremity  Patient reports 2 to 53-month history of intermittent numbness of left upper extremity.  No current symptoms.  Neurologically at her baseline at this time.  Given component of wrist, could be related to carpal tunnel, however with radiation up the left arm, could be related to cervical pathology/shoulder pathology.  Will have patient discussed with neurologist, can consider EMG/NCT studies.  Will obtain CBC/CMP/TSH for screening at this time.  Reiterated importance for patient to follow-up with her neurologist.  Orders:    Referral to Neurology ([facility]); Future    Comprehensive Metabolic Panel; Future    CBC with Differential  (Order); Future    Thyroid  Stimulating Hormone (TSH) with Reflex to Free T4; Future    Follow Up In Primary Care; Future

## 2024-03-03 ENCOUNTER — Ambulatory Visit (INDEPENDENT_AMBULATORY_CARE_PROVIDER_SITE_OTHER): Payer: Self-pay | Admitting: Family Medicine

## 2024-03-04 NOTE — Telephone Encounter (Signed)
 Attempted to contact patient and was not able to leave voice message.

## 2024-03-18 ENCOUNTER — Other Ambulatory Visit (INDEPENDENT_AMBULATORY_CARE_PROVIDER_SITE_OTHER): Payer: Self-pay | Admitting: Family Medicine

## 2024-03-18 ENCOUNTER — Other Ambulatory Visit (INDEPENDENT_AMBULATORY_CARE_PROVIDER_SITE_OTHER): Payer: Self-pay | Admitting: Psychiatry

## 2024-04-07 ENCOUNTER — Ambulatory Visit (INDEPENDENT_AMBULATORY_CARE_PROVIDER_SITE_OTHER): Admitting: Psychiatry

## 2024-04-18 ENCOUNTER — Ambulatory Visit (INDEPENDENT_AMBULATORY_CARE_PROVIDER_SITE_OTHER): Admitting: Family Medicine

## 2024-04-18 ENCOUNTER — Encounter (INDEPENDENT_AMBULATORY_CARE_PROVIDER_SITE_OTHER): Payer: Self-pay | Admitting: Family Medicine

## 2024-04-18 VITALS — BP 119/87 | HR 98 | Temp 97.5°F | Ht 63.0 in

## 2024-04-18 DIAGNOSIS — F419 Anxiety disorder, unspecified: Secondary | ICD-10-CM

## 2024-04-18 DIAGNOSIS — R2681 Unsteadiness on feet: Secondary | ICD-10-CM

## 2024-04-18 DIAGNOSIS — F32A Depression, unspecified: Secondary | ICD-10-CM

## 2024-04-18 NOTE — Progress Notes (Signed)
 [facility]   OFFICE VISIT                HPI     Chief Complaint   Patient presents with    Referral To Another Service     Requests referral for physical therapy. Presents with aide.      HPI    33 yo female w/ PMH depression/anxiety, seizure disorder, genital herpes, schwannoma, laminectomy, who presents for follow-up.     Pt reports she would like to follow with physical therapy. She reports still w/ concerns of chronic unsteadiness, utilizing wheelchair.   Previously recommended following up w/ neurology, neurosurgery, PM&R. Pt has est w/ neurology.   Advised to est w/ rec specialists.     Pt reports she had a fall where she hit her RT arm and lower abdomen. She [facility], they performed XR of the arm, w/in normal limits.   States bruising at areas of fall, improving. No head injury. No LOC.   No HA, focal weakness, paresthesias, dizziness, lightheadedness. No worsening of pre-existing unsteadiness.     ROS   Review of Systems    Constitutional:  Negative for chills and fever.   Eyes:  Negative for visual disturbance.   Respiratory:  Negative for cough and shortness of breath.    Cardiovascular:  Negative for chest pain, palpitations and leg swelling.   Gastrointestinal:  Negative for abdominal pain, constipation, diarrhea, nausea and vomiting.   Musculoskeletal:  Positive for back pain and gait problem (Chronic).   Skin:  Negative for rash.   Neurological:  Positive for tremors (chronic) and numbness. Negative for dizziness, seizures, syncope, facial asymmetry, speech difficulty, weakness, light-headedness and headaches.   Vital Signs   BP 119/87 (BP Site: Right arm, Patient Position: Sitting, Cuff Size: Small)   Pulse (!) 101   Temp 97.5 F (36.4 C) (Oral)   Ht 1.6 m (5' 3)   SpO2 98%   BMI 34.33 kg/m   Physical Exam   Physical Exam   Vitals and nursing note reviewed.   Constitutional:       General: She is not in acute distress.     Appearance: Normal  appearance. She is not ill-appearing, toxic-appearing or diaphoretic.   HENT:      Head: Normocephalic and atraumatic.      Nose: Nose normal.      Mouth/Throat:      Mouth: Mucous membranes are moist.      Pharynx: Oropharynx is clear.   Eyes:      General: No scleral icterus.     Extraocular Movements: Extraocular movements intact.      Conjunctiva/sclera: Conjunctivae normal.      Pupils: Pupils are equal, round, and reactive to light.   Cardiovascular:      Rate and Rhythm: Normal rate and regular rhythm.      Heart sounds: No murmur heard.  Pulmonary:      Effort: Pulmonary effort is normal. No respiratory distress.      Breath sounds: Normal breath sounds. No stridor. No wheezing.   Abdominal:      Palpations: Abdomen is soft.      Tenderness: There is no abdominal tenderness. There is no guarding or rebound.   Musculoskeletal:         General: Normal range of motion.      Cervical back: Normal range of motion and neck supple.      Right lower  leg: No edema.      Left lower leg: No edema.   Skin:     General: Skin is warm. Healing bruises of RT upper arm and lower abdomen.   Neurological:      General: No focal deficit present.      Mental Status: She is alert and oriented to person, place, and time. Mental status is at baseline.      Cranial Nerves: No cranial nerve deficit.      Sensory: No sensory deficit.      Motor: No weakness.      Comments: In wheelchair   Psychiatric:         Mood and Affect: Mood normal.   Assessment/Plan     Assessment & Plan  Unsteadiness  Chronic.  Previously seen by neurology when she was incarcerated,has est w/ [facility] neurology.  Per review of prior chart, ER note on 11/09/2021, evaluation for seizure at that time, with noted ataxic gait (reported chronic per patient), neurosurgery note on 03/06/2023 indicated concerns with gait/unsteadiness requiring a rollator to ambulate.  Other conflicting notes stating gait was unremarkable.  When asking patient and patient's manager at the  facility, patient reports this has been chronic since her back surgery in 2022 and chronic tremors of bilateral hands ongoing for years prior.  Manager at the facility also previously confirmed that patient is currently at her baseline since she arrived to the facility on 04/2023. MRI Brain 12/18/2021 indicated no evidence of significant intracranial abnormality.  Redemonstrated well circumscribed, enhancing submucosal mass in the right tonsillar fossa measuring 1.9 cm, minimally increased in size from 2020, favored benign. Of note, pt had removal by ENT of tonsillar mass on 04/04/22.  Also given unsteadiness status post back surgery per patient, patient would like to be evaluated by a new neurosurgeon, rec following up as previously recommend.  Also discussed if residual complications s/p back surgery, evaluation by PM&R physician for rehabilitation could be a good option also, patient agrees, will again provide referral.   Orders:    Physical Medicine and Rehabilitation Referral: Maude Ohara, MD [facility]); Future    Referral to Physical Therapy - EXTERNAL; Future    Anxiety and depression  Chronic. Overdue for follow up with psychiatry, pt to follow up.

## 2024-04-26 ENCOUNTER — Other Ambulatory Visit (INDEPENDENT_AMBULATORY_CARE_PROVIDER_SITE_OTHER): Payer: Self-pay | Admitting: Family Medicine

## 2024-04-26 ENCOUNTER — Other Ambulatory Visit (INDEPENDENT_AMBULATORY_CARE_PROVIDER_SITE_OTHER): Payer: Self-pay | Admitting: Psychiatry

## 2024-04-28 NOTE — Telephone Encounter (Signed)
 Last office visit:04/18/24  Last refill on:04/25/24  Upcoming appointment: 05/02/24

## 2024-05-02 ENCOUNTER — Ambulatory Visit (INDEPENDENT_AMBULATORY_CARE_PROVIDER_SITE_OTHER): Admitting: Family Medicine

## 2024-05-24 ENCOUNTER — Other Ambulatory Visit (INDEPENDENT_AMBULATORY_CARE_PROVIDER_SITE_OTHER): Payer: Self-pay | Admitting: Psychiatry

## 2024-05-24 ENCOUNTER — Other Ambulatory Visit (INDEPENDENT_AMBULATORY_CARE_PROVIDER_SITE_OTHER): Payer: Self-pay | Admitting: Family Medicine

## 2024-05-24 NOTE — Telephone Encounter (Signed)
 Last ordered: 1 month ago       LOV- 03/01/2024 herpes

## 2024-05-25 ENCOUNTER — Ambulatory Visit (INDEPENDENT_AMBULATORY_CARE_PROVIDER_SITE_OTHER): Admitting: Family Medicine

## 2024-05-25 ENCOUNTER — Encounter (INDEPENDENT_AMBULATORY_CARE_PROVIDER_SITE_OTHER): Payer: Self-pay | Admitting: Family Medicine

## 2024-05-25 VITALS — BP 131/87 | HR 96 | Temp 97.6°F | Ht 63.39 in | Wt 192.0 lb

## 2024-05-25 DIAGNOSIS — R2681 Unsteadiness on feet: Secondary | ICD-10-CM

## 2024-05-25 DIAGNOSIS — G43809 Other migraine, not intractable, without status migrainosus: Secondary | ICD-10-CM

## 2024-05-25 DIAGNOSIS — D3610 Benign neoplasm of peripheral nerves and autonomic nervous system, unspecified: Secondary | ICD-10-CM

## 2024-05-25 LAB — LAB USE ONLY - CBC WITH DIFFERENTIAL
Absolute Basophils: 0.07 x10 3/uL (ref 0.00–0.08)
Absolute Eosinophils: 0.25 x10 3/uL (ref 0.00–0.44)
Absolute Immature Granulocytes: 0.02 x10 3/uL (ref 0.00–0.07)
Absolute Lymphocytes: 1.72 x10 3/uL (ref 0.42–3.22)
Absolute Monocytes: 0.59 x10 3/uL (ref 0.21–0.85)
Absolute Neutrophils: 3.64 x10 3/uL (ref 1.10–6.33)
Absolute nRBC: 0 x10 3/uL (ref <=0.00)
Basophils %: 1.1 %
Eosinophils %: 4 %
Hematocrit: 43.9 % — ABNORMAL HIGH (ref 34.7–43.7)
Hemoglobin: 15.2 g/dL — ABNORMAL HIGH (ref 11.4–14.8)
Immature Granulocytes %: 0.3 %
Lymphocytes %: 27.3 %
MCH: 30 pg (ref 25.1–33.5)
MCHC: 34.6 g/dL (ref 31.5–35.8)
MCV: 86.6 fL (ref 78.0–96.0)
MPV: 10.3 fL (ref 8.9–12.5)
Monocytes %: 9.4 %
Neutrophils %: 57.9 %
Platelet Count: 302 x10 3/uL (ref 142–346)
Preliminary Absolute Neutrophil Count: 3.64 x10 3/uL (ref 1.10–6.33)
RBC: 5.07 x10 6/uL (ref 3.90–5.10)
RDW: 13 % (ref 11–15)
WBC: 6.29 x10 3/uL (ref 3.10–9.50)
nRBC %: 0 /100{WBCs} (ref <=0.0)

## 2024-05-25 LAB — COMPREHENSIVE METABOLIC PANEL
ALT: 27 U/L (ref <=55)
AST (SGOT): 29 U/L (ref <=41)
Albumin/Globulin Ratio: 1.8 (ref 0.9–2.2)
Albumin: 4.7 g/dL (ref 3.5–4.9)
Alkaline Phosphatase: 131 U/L — ABNORMAL HIGH (ref 37–117)
Anion Gap: 10 (ref 5.0–15.0)
BUN: 12 mg/dL (ref 7–21)
Bilirubin, Total: 0.3 mg/dL (ref 0.2–1.2)
CO2: 23 meq/L (ref 17–29)
Calcium: 9.4 mg/dL (ref 8.5–10.5)
Chloride: 109 meq/L (ref 99–111)
Creatinine: 0.6 mg/dL (ref 0.4–1.0)
GFR: >60 mL/min/1.73 m2 (ref >=60.0)
Globulin: 2.6 g/dL (ref 2.0–3.6)
Glucose: 93 mg/dL (ref 70–100)
Hemolysis Index: 9 {index}
Potassium: 4.2 meq/L (ref 3.5–5.3)
Protein, Total: 7.3 g/dL (ref 6.0–8.3)
Sodium: 142 meq/L (ref 135–145)

## 2024-05-25 LAB — THYROID STIMULATING HORMONE (TSH) WITH REFLEX TO FREE T4: TSH: 3.1 u[IU]/mL (ref 0.35–4.94)

## 2024-05-25 MED ORDER — ASPIRIN-ACETAMINOPHEN-CAFFEINE 250-250-65 MG PO TABS: 2.0000 | ORAL_TABLET | ORAL | 0 refills | Status: AC | PRN

## 2024-05-25 NOTE — Patient Instructions (Signed)
 The SEEDs approach is a simple way to help manage headaches by focusing on five important lifestyle habits: Sleep, Exercise, Eating, Diary, and Stress. Making small changes in these areas can help prevent headaches and make them less severe.    Sleep:  Try to go to bed and wake up at the same time every day, even on weekends. Aim for 7-9 hours of sleep each night. Avoid naps longer than 30 minutes and limit screen time before bed. Good sleep habits can help reduce headaches.  Exercise:  Regular physical activity is important. Try to get at least 30 minutes of moderate exercise, like walking or biking, most days of the week. Start slowly if you are not used to exercising. Exercise can help prevent headaches and improve your mood.  Eating:  Eat regular, balanced meals and do not skip meals. Try to eat at the same times each day. Drink plenty of water and limit caffeine and alcohol. Some foods can trigger headaches, so pay attention to what you eat and how it makes you feel.  Diary:  Keep a headache diary. Write down when you get headaches, how bad they are, what you ate, how you slept, and any stress you felt. This can help you and your doctor find patterns and triggers for your headaches.  Stress:  Find ways to manage stress, such as deep breathing, relaxation exercises, or gentle stretching. Taking breaks, talking to friends or family, and doing activities you enjoy can also help. Managing stress can lower the number and severity of headaches.  Remember:  Making changes in these areas may take time, but even small steps can make a difference. If you have questions or need help, talk to your healthcare Donnielle Addison.

## 2024-05-25 NOTE — Progress Notes (Signed)
 Have you seen any specialists/other providers since your last visit with us ?    Yes    The patient was informed that the following HM items are still outstanding:     Health Maintenance Due   Topic Date Due    Cervical Cancer Screening  Never done    HEPATITIS C SCREENING  Never done    Influenza Vaccine  01/15/2024

## 2024-05-25 NOTE — Progress Notes (Signed)
 [facility]   OFFICE VISIT                HPI     Chief Complaint   Patient presents with    Headache     Follow up      HPI    33 yo female w/ PMH depression/anxiety, seizure disorder, genital herpes, schwannoma, laminectomy, migraines, who presents for follow-up.     Pt reports hx of migraines, states since childhood.   Reports sxs feel like her head is chopped off.   States she takes Excedrin  w/ relief.     [facility] and [facility] medication for migraines, ?triptan, pt reported still w/ her usual migraine sxs at this time. Reports that Excedrin  usually helps, however facility needs rx for medication.   Pt reports she has not discussed migraines w/ her neurologist, however has upcoming appt w/ neurology 06/08/2024.   States she feels low interest, decreased energy, reports that her sleep has been very poor.  Patient reports she follows with a new psychiatrist Dr. Ezell, patient reports she has not discussed this with her psychiatrist.  Patient is on quetiapine , fluoxetine , BuSpar .  Patient reports she is hesitant to opening up to her specialist, however reports that she is starting to feel more comfortable.    ROS   Review of Systems    Constitutional:  Negative for chills and fever.   Eyes:  Negative for visual disturbance.   Respiratory:  Negative for cough and shortness of breath.    Cardiovascular:  Negative for chest pain, palpitations and leg swelling.   Gastrointestinal:  Negative for abdominal pain, constipation, diarrhea, nausea and vomiting.   Musculoskeletal:  Positive for back pain and gait problem (Chronic).   Skin:  Negative for rash.   Neurological:  Positive for tremors (chronic- improved on exam), headache. Negative for numbness, dizziness, seizures, syncope, facial asymmetry, speech difficulty, weakness, light-headedness and headaches.   Vital Signs   BP 131/87 (BP Site: Right arm, Patient Position: Sitting, Cuff Size: Medium)   Pulse 96   Temp 97.6 F (36.4  C) (Temporal)   Ht 1.61 m (5' 3.39)   Wt 87.1 kg (192 lb)   SpO2 98%   BMI 33.60 kg/m   Physical Exam   Physical Exam  Vitals and nursing note reviewed.   Constitutional:       General: She is not in acute distress.     Appearance: Normal appearance. She is obese. She is not ill-appearing, toxic-appearing or diaphoretic.   HENT:      Head: Normocephalic and atraumatic.      Right Ear: Tympanic membrane and ear canal normal.      Left Ear: Tympanic membrane and ear canal normal.      Nose: Nose normal.      Mouth/Throat:      Mouth: Mucous membranes are moist.      Pharynx: Oropharynx is clear.   Eyes:      General: No scleral icterus.     Extraocular Movements: Extraocular movements intact.      Conjunctiva/sclera: Conjunctivae normal.      Pupils: Pupils are equal, round, and reactive to light.   Cardiovascular:      Rate and Rhythm: Normal rate and regular rhythm.      Heart sounds: No murmur heard.  Pulmonary:      Effort: Pulmonary effort is normal. No respiratory distress.      Breath sounds:  Normal breath sounds. No stridor. No wheezing.   Abdominal:      General: Abdomen is flat.      Palpations: Abdomen is soft.      Tenderness: There is no abdominal tenderness. There is no guarding or rebound.   Musculoskeletal:         General: Normal range of motion.      Cervical back: Normal range of motion and neck supple.   Skin:     General: Skin is warm.   Neurological:      General: No focal deficit present.      Mental Status: She is alert and oriented to person, place, and time. Mental status is at baseline.      Cranial Nerves: No cranial nerve deficit.      Sensory: No sensory deficit.      Motor: No weakness.      Coordination: Coordination normal.      Gait: Gait normal.      Comments: Using walker today   Psychiatric:         Mood and Affect: Mood normal.            Assessment/Plan     Assessment & Plan  Other migraine without status migrainosus, not intractable  Chronic.  Had shared discussion in  regards to factors that can influence migraine/worsening headaches, SEEDs care discussed.  Advised patient to discuss with her specialist including neurology/psychiatry in regards to improvement of possible symptoms.  Discussed medication overuse syndromes that can occur with continued OTC medications, guidelines provided below.  [facility] reports [facility], thought to be a triptan, states it did not seem to help her symptoms, neurologically at her baseline at this time.  Advised patient to discuss with her psychiatrist in regards to medication adjustment, specifically Seroquel  to help assist with sleep.  Also advised patient to discuss with her neurologist in regards to her chronic migraines, has upcoming appointment in 2 weeks.  Orders:    CBC with Differential (Order); Future    Comprehensive Metabolic Panel; Future    Thyroid  Stimulating Hormone (TSH) with Reflex to Free T4; Future    aspirin -acetaminophen -caffeine  (Excedrin  Migraine) 250-250-65 MG per tablet; Take 2 tablets by mouth as needed (migraine) Maximum: 2 tablets per 24 hours. Limit use to no more than 2 days per week    Unsteadiness  Schwannoma  Chronic.  Patient using walker instead of wheelchair today, strength/sensation appropriate.  Previously seen by neurology when she was incarcerated,has est w/ [facility] neurology.  Per review of prior chart, ER note on 11/09/2021, evaluation for seizure at that time, with noted ataxic gait (reported chronic per patient), neurosurgery note on 03/06/2023 indicated concerns with gait/unsteadiness requiring a rollator to ambulate.  Other conflicting notes stating gait was unremarkable.  When asking patient and patient's manager at the facility, patient reports this has been chronic since her back surgery in 2022 and chronic tremors of bilateral hands ongoing for years prior.  Manager at the facility also previously confirmed that patient is currently at her baseline since  she arrived to the facility on 04/2023. MRI Brain 12/18/2021 indicated no evidence of significant intracranial abnormality.  Redemonstrated well circumscribed, enhancing submucosal mass in the right tonsillar fossa measuring 1.9 cm, minimally increased in size from 2020, favored benign. Of note, pt had removal by ENT of tonsillar mass on 04/04/22.  Also given unsteadiness status post back surgery per patient, patient would like to be evaluated by  a new neurosurgeon, rec following up as previously recommend.  Also discussed if residual complications s/p back surgery, evaluation by PM&R physician for rehabilitation could be a good option also, patient agrees, will again provide referral.   Also [facility], referral provided.    Orders:    Physical Medicine and Rehabilitation Referral: Sharolyn Dike, MD [facility]); Future    Referral to Pain Clinic (EXTERNAL); Future

## 2024-05-26 ENCOUNTER — Ambulatory Visit (INDEPENDENT_AMBULATORY_CARE_PROVIDER_SITE_OTHER): Payer: Self-pay | Admitting: Family Medicine

## 2024-05-26 NOTE — Telephone Encounter (Signed)
 Called pt relayed information, information was understood

## 2024-06-03 ENCOUNTER — Encounter (INDEPENDENT_AMBULATORY_CARE_PROVIDER_SITE_OTHER): Payer: Self-pay | Admitting: Family Medicine

## 2024-06-08 ENCOUNTER — Encounter: Payer: Self-pay | Admitting: Neurology

## 2024-06-08 ENCOUNTER — Ambulatory Visit: Attending: Neurology | Admitting: Neurology

## 2024-06-08 VITALS — Ht 63.0 in | Wt 195.0 lb

## 2024-06-08 DIAGNOSIS — G40909 Epilepsy, unspecified, not intractable, without status epilepticus: Secondary | ICD-10-CM

## 2024-06-08 NOTE — Progress Notes (Signed)
 Subjective:      Patient ID: Kayla Foster is a 33 y.o. female.    HPI    Verbal consent has been obtained from the patient to conduct a video visit encounter to minimize exposure to COVID-19.  The patient is a 33 years old female who is being seen via telemedicine for seizure disorder.  She denies having any seizures since the last visit, would like to get the medication for sleep as she has not been able to sleep well in the last few weeks.  She is currently taking Keppra  1000 mg twice daily, denies any side effects on the medication, would like to get the prescription for the same.    Prior note:    The patient is a pleasant 33 years old right-handed morbidly obese Caucasian female who is here to establish a new neurologist for continued care of seizure disorder.  She is recommended by staff from group home where she has been living for the last 3 months.  The patient reports having 1 possible seizure during sleep when she woke up with generalized body weakness and soreness.  She has been seen at [facility] in August 2024 and the dose of Keppra  was increased to 1000 mg twice daily.  She believes with the current dose, the seizures seems to have improved significantly, she used to get many a month but now she has not had any in the last few months.  She does not remember having taken any other antiseizure medication in the past.  She believes that her seizures started 10 years ago, they were more frequent in the past and now with the increased dose of levetiracetam .  She describes the seizures as 1 onset of generalized tonic-clonic activity sometimes waking up with sore muscles and foaming in the mouth.    Allergies[1]    The following portions of the patient's history were reviewed and updated as appropriate: allergies, current medications, past family history, past medical history, past social history, past surgical history, and problem list.    Medical History[2]    Medications Ordered Prior to  Encounter[3]    Review of Systems   Constitutional:  Positive for activity change and fatigue.   HENT: Negative.     Eyes: Negative.    Respiratory: Negative.     Cardiovascular: Negative.    Gastrointestinal: Negative.    Endocrine: Negative.    Genitourinary: Negative.    Musculoskeletal:  Positive for arthralgias, back pain and gait problem.   Skin: Negative.    Allergic/Immunologic: Negative.    Neurological:  Positive for tremors, seizures and weakness.   Hematological: Negative.    Psychiatric/Behavioral:  Positive for behavioral problems, decreased concentration and sleep disturbance. The patient is nervous/anxious.      Family History[4]      Objective:     Vitals:    06/08/24 0848   Weight: 88.5 kg (195 lb)   Height: 1.6 m (5' 3)     Neurological Exam    Limited neurological exam  The patient was seen sitting on chair in no acute distress  Speech was clear, there was no facial asymmetry, cranial nerves appear symmetrical and intact.  Moving both upper extremities equally with no apparent focal deficit.  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 If there are any new neurologic symptoms or worsening of current symptoms, the patient is instructed to contact us  by  telephone.    Kayla Bring, MD  Neurology, Neurophysiology  [facility]                   [1] No Known Allergies  [2]   Past Medical History:  Diagnosis Date    Herpes     Neuroblastoma (CMS/HCC)     Seizures (CMS/HCC)    [3]   Current Outpatient Medications on File Prior to Visit   Medication Sig Dispense Refill    acyclovir  (ZOVIRAX ) 400 MG tablet Take 1 tablet (400 mg) by mouth 2 (two) times daily 60 tablet 3    aspirin -acetaminophen -caffeine  (Excedrin  Migraine) 250-250-65 MG per tablet Take 2 tablets by mouth as needed (migraine) Maximum: 2 tablets per 24 hours. Limit use to no more than 2 days per week 20 tablet 0    busPIRone  (BUSPAR ) 15 MG tablet Take 1/2 tab twice daily for 1 week then increase to 1 tab twice daily 60 tablet 2    etonogestrel  (Nexplanon ) 68 MG implant 1 Implant (68 mg) by Subdermal route once Lot # [id number]; Exp: February 2027      FLUoxetine  (PROzac ) 20 MG capsule Take 1 capsule (20 mg) by mouth daily 30 capsule 2    levETIRAcetam  (Keppra ) 1000 MG tablet Take 1 tablet (1,000 mg) by mouth 2 (two) times daily 60 tablet 5    QUEtiapine  (SEROquel -XR) 150 MG Tablet SR 24 hr Take 1 tab at 6 pm before dinner 30 tablet 1    sulfamethoxazole-trimethoprim (BACTRIM DS) 800-160 MG per tablet Take 1 tablet by mouth 2 (two) times daily      [DISCONTINUED] benztropine (COGENTIN) 1 MG tablet Take 1 tablet (1 mg) by mouth once daily      [DISCONTINUED] hydrOXYzine (ATARAX) 50 MG tablet Take 1 tablet (50 mg) by mouth every 8 (eight) hours as needed for Anxiety 30 tablet 1    [DISCONTINUED] ondansetron  (ZOFRAN -ODT) 4 MG disintegrating tablet Take 1 tablet (4 mg) by mouth every 6 (six) hours as needed for Nausea (Patient not taking: Reported on 05/25/2024) 8 tablet 0     No current facility-administered medications on file prior to visit.   [4]   Family History  Adopted: Yes

## 2024-06-27 ENCOUNTER — Other Ambulatory Visit (INDEPENDENT_AMBULATORY_CARE_PROVIDER_SITE_OTHER): Payer: Self-pay | Admitting: Psychiatry

## 2024-06-27 ENCOUNTER — Other Ambulatory Visit (INDEPENDENT_AMBULATORY_CARE_PROVIDER_SITE_OTHER): Payer: Self-pay | Admitting: Family Medicine

## 2024-06-27 DIAGNOSIS — B009 Herpesviral infection, unspecified: Secondary | ICD-10-CM

## 2024-06-28 NOTE — Telephone Encounter (Signed)
 Last office visit: 05-25-24  Last refill on: 03-01-24  Upcoming appointment: none
